# Patient Record
Sex: Female | Born: 1935 | Race: Black or African American | Hispanic: No | Marital: Single | Smoking: Never smoker
Health system: Southern US, Community
[De-identification: ages and names within clinical notes are randomized; demographics above are authoritative.]

## PROBLEM LIST (undated history)

## (undated) DIAGNOSIS — H409 Unspecified glaucoma: Secondary | ICD-10-CM

## (undated) DIAGNOSIS — D631 Anemia in chronic kidney disease: Secondary | ICD-10-CM

## (undated) DIAGNOSIS — I639 Cerebral infarction, unspecified: Secondary | ICD-10-CM

## (undated) DIAGNOSIS — M8580 Other specified disorders of bone density and structure, unspecified site: Secondary | ICD-10-CM

## (undated) DIAGNOSIS — N189 Chronic kidney disease, unspecified: Secondary | ICD-10-CM

## (undated) DIAGNOSIS — I10 Essential (primary) hypertension: Secondary | ICD-10-CM

## (undated) DIAGNOSIS — N182 Chronic kidney disease, stage 2 (mild): Secondary | ICD-10-CM

## (undated) DIAGNOSIS — E119 Type 2 diabetes mellitus without complications: Secondary | ICD-10-CM

## (undated) HISTORY — PX: ABDOMINAL HYSTERECTOMY: SHX81

---

## 2020-02-28 ENCOUNTER — Emergency Department (HOSPITAL_COMMUNITY): Payer: Medicare PPO

## 2020-02-28 ENCOUNTER — Encounter (HOSPITAL_COMMUNITY): Payer: Self-pay

## 2020-02-28 ENCOUNTER — Other Ambulatory Visit: Payer: Self-pay

## 2020-02-28 ENCOUNTER — Emergency Department (HOSPITAL_COMMUNITY)
Admission: EM | Admit: 2020-02-28 | Discharge: 2020-03-01 | Disposition: A | Discharge status: Skilled Nursing Facility | Payer: Medicare PPO | Attending: Emergency Medicine | Admitting: Emergency Medicine

## 2020-02-28 DIAGNOSIS — W010XXA Fall on same level from slipping, tripping and stumbling without subsequent striking against object, initial encounter: Secondary | ICD-10-CM | POA: Insufficient documentation

## 2020-02-28 DIAGNOSIS — I129 Hypertensive chronic kidney disease with stage 1 through stage 4 chronic kidney disease, or unspecified chronic kidney disease: Secondary | ICD-10-CM | POA: Insufficient documentation

## 2020-02-28 DIAGNOSIS — Y939 Activity, unspecified: Secondary | ICD-10-CM | POA: Insufficient documentation

## 2020-02-28 DIAGNOSIS — S4991XA Unspecified injury of right shoulder and upper arm, initial encounter: Secondary | ICD-10-CM | POA: Diagnosis present

## 2020-02-28 DIAGNOSIS — Z7984 Long term (current) use of oral hypoglycemic drugs: Secondary | ICD-10-CM | POA: Diagnosis not present

## 2020-02-28 DIAGNOSIS — S42321A Displaced transverse fracture of shaft of humerus, right arm, initial encounter for closed fracture: Secondary | ICD-10-CM | POA: Insufficient documentation

## 2020-02-28 DIAGNOSIS — Y92009 Unspecified place in unspecified non-institutional (private) residence as the place of occurrence of the external cause: Secondary | ICD-10-CM | POA: Diagnosis not present

## 2020-02-28 DIAGNOSIS — E1122 Type 2 diabetes mellitus with diabetic chronic kidney disease: Secondary | ICD-10-CM | POA: Insufficient documentation

## 2020-02-28 DIAGNOSIS — Y999 Unspecified external cause status: Secondary | ICD-10-CM | POA: Insufficient documentation

## 2020-02-28 DIAGNOSIS — N182 Chronic kidney disease, stage 2 (mild): Secondary | ICD-10-CM | POA: Diagnosis not present

## 2020-02-28 DIAGNOSIS — N189 Chronic kidney disease, unspecified: Secondary | ICD-10-CM | POA: Insufficient documentation

## 2020-02-28 DIAGNOSIS — Z79899 Other long term (current) drug therapy: Secondary | ICD-10-CM | POA: Insufficient documentation

## 2020-02-28 DIAGNOSIS — M8580 Other specified disorders of bone density and structure, unspecified site: Secondary | ICD-10-CM | POA: Insufficient documentation

## 2020-02-28 DIAGNOSIS — H409 Unspecified glaucoma: Secondary | ICD-10-CM | POA: Insufficient documentation

## 2020-02-28 DIAGNOSIS — I10 Essential (primary) hypertension: Secondary | ICD-10-CM | POA: Insufficient documentation

## 2020-02-28 HISTORY — DX: Other specified disorders of bone density and structure, unspecified site: M85.80

## 2020-02-28 HISTORY — DX: Anemia in chronic kidney disease: D63.1

## 2020-02-28 HISTORY — DX: Essential (primary) hypertension: I10

## 2020-02-28 HISTORY — DX: Cerebral infarction, unspecified: I63.9

## 2020-02-28 HISTORY — DX: Chronic kidney disease, unspecified: N18.9

## 2020-02-28 HISTORY — DX: Type 2 diabetes mellitus without complications: E11.9

## 2020-02-28 HISTORY — DX: Unspecified glaucoma: H40.9

## 2020-02-28 HISTORY — DX: Chronic kidney disease, stage 2 (mild): N18.2

## 2020-02-28 LAB — CBG MONITORING, ED: Glucose-Capillary: 170 mg/dL — ABNORMAL HIGH (ref 70–99)

## 2020-02-28 MED ORDER — OXYCODONE-ACETAMINOPHEN 5-325 MG PO TABS: 1.0000 | ORAL_TABLET | Freq: Four times a day (QID) | ORAL | 0 refills | Status: DC | PRN

## 2020-02-28 MED ORDER — LATANOPROST 0.005 % OP SOLN
1.0000 [drp] | Freq: Every day | OPHTHALMIC | Status: DC
  Administered 2020-02-28 – 2020-03-01 (×2): 1 [drp] via OPHTHALMIC
  Filled 2020-02-28 (×2): qty 2.5

## 2020-02-28 MED ORDER — TIMOLOL MALEATE 0.5 % OP SOLN
1.0000 [drp] | Freq: Every day | OPHTHALMIC | Status: DC
  Administered 2020-02-28 – 2020-03-01 (×2): 1 [drp] via OPHTHALMIC
  Filled 2020-02-28 (×2): qty 5

## 2020-02-28 MED ORDER — POLYSACCHARIDE IRON COMPLEX 150 MG PO CAPS
150.0000 mg | ORAL_CAPSULE | Freq: Every day | ORAL | Status: DC
  Administered 2020-02-28 – 2020-03-01 (×3): 150 mg via ORAL
  Filled 2020-02-28 (×3): qty 1

## 2020-02-28 MED ORDER — INSULIN GLARGINE 100 UNIT/ML ~~LOC~~ SOLN
20.0000 [IU] | Freq: Every day | SUBCUTANEOUS | Status: DC
  Administered 2020-02-28 – 2020-03-01 (×3): 20 [IU] via SUBCUTANEOUS
  Filled 2020-02-28 (×3): qty 0.2

## 2020-02-28 MED ORDER — AMLODIPINE BESYLATE 5 MG PO TABS
10.0000 mg | ORAL_TABLET | Freq: Every day | ORAL | Status: DC
  Administered 2020-02-28 – 2020-03-01 (×3): 10 mg via ORAL
  Filled 2020-02-28 (×3): qty 2

## 2020-02-28 MED ORDER — LEVOTHYROXINE SODIUM 88 MCG PO TABS
88.0000 ug | ORAL_TABLET | Freq: Every day | ORAL | Status: DC
  Administered 2020-02-28 – 2020-03-01 (×3): 88 ug via ORAL
  Filled 2020-02-28 (×3): qty 1

## 2020-02-28 MED ORDER — OXYCODONE-ACETAMINOPHEN 5-325 MG PO TABS
1.0000 | ORAL_TABLET | Freq: Once | ORAL | Status: AC
  Administered 2020-02-28: 1 via ORAL
  Filled 2020-02-28: qty 1

## 2020-02-28 MED ORDER — HYDROCHLOROTHIAZIDE 25 MG PO TABS
25.0000 mg | ORAL_TABLET | Freq: Every day | ORAL | Status: DC
  Administered 2020-02-28 – 2020-03-01 (×3): 25 mg via ORAL
  Filled 2020-02-28 (×3): qty 1

## 2020-02-28 MED ORDER — FENTANYL CITRATE (PF) 100 MCG/2ML IJ SOLN
50.0000 ug | Freq: Once | INTRAMUSCULAR | Status: AC
  Administered 2020-02-28: 50 ug via INTRAMUSCULAR
  Filled 2020-02-28: qty 2

## 2020-02-28 MED ORDER — GLIPIZIDE 5 MG PO TABS
2.5000 mg | ORAL_TABLET | Freq: Two times a day (BID) | ORAL | Status: DC
  Administered 2020-02-28 – 2020-03-01 (×3): 2.5 mg via ORAL
  Filled 2020-02-28 (×2): qty 0.5
  Filled 2020-02-28: qty 1
  Filled 2020-02-28 (×4): qty 0.5

## 2020-02-28 MED ORDER — FUROSEMIDE 20 MG PO TABS
20.0000 mg | ORAL_TABLET | Freq: Every day | ORAL | Status: DC
  Administered 2020-02-28 – 2020-03-01 (×3): 20 mg via ORAL
  Filled 2020-02-28 (×3): qty 1

## 2020-02-28 MED ORDER — ATORVASTATIN CALCIUM 40 MG PO TABS
40.0000 mg | ORAL_TABLET | Freq: Every day | ORAL | Status: DC
  Administered 2020-02-29: 40 mg via ORAL
  Filled 2020-02-28: qty 1

## 2020-02-28 NOTE — Evaluation (Signed)
Physical Therapy Evaluation Patient Details Name: Yvonne Henderson MRN: [id number] DOB: 02/28/1927 Today's Date: 02/28/2020   History of Present Illness  Patient is a 86 year old female who presented to the ED following a fall  on 6/8 /21. Patient was recently DC'd from SNF post stroke. Patient found to have displaced humeral shaft  fracture on RUE, splinted and in a sling.  Clinical Impression   The patient is requiring extensive assistance for mobility, now has R humeral fracture. Per granddaughter, patient's mental and physical status is far from her  baseline, patient was independent except driving,  prior to stroke. Pt admitted with above diagnosis.  Pt currently with functional limitations due to the deficits listed below (see PT Problem List). Pt will benefit from skilled PT to increase their independence and safety with mobility to allow discharge to the venue listed below.   If DC'd back to home, patient will  Need DME recommended and possible non emergency transport. Granddaughter is interested.    Follow Up Recommendations SNF;Supervision/Assistance - 24 hour, HHPT and OT if DC home.    Equipment Recommendations  Wheelchair (measurements PT);Wheelchair cushion (measurements PT);3in1 (PT)(tub bench)    Recommendations for Other Services       Precautions / Restrictions Precautions Required Braces or Orthoses: Sling Restrictions Weight Bearing Restrictions: Yes RUE Weight Bearing: Non weight bearing      Mobility  Bed Mobility Overal bed mobility: Needs Assistance Bed Mobility: Supine to Sit;Sit to Supine     Supine to sit: Mod assist;HOB elevated Sit to supine: Mod assist;HOB elevated   General bed mobility comments: mod assist with trunk, patient moved ;egs over bed edge, assisted with klegs and trunk back to supine.  Transfers                 General transfer comment: NT due to stetcher height and patient short in stature and not deemed safe to attempt with  concern not being able to get back onto the stretcher.  Ambulation/Gait                Stairs            Wheelchair Mobility    Modified Rankin (Stroke Patients Only)       Balance Overall balance assessment: History of Falls;Needs assistance Sitting-balance support: Feet unsupported;Single extremity supported Sitting balance-Leahy Scale: Poor Sitting balance - Comments: very drowsy Postural control: Posterior lean                                   Pertinent Vitals/Pain Pain Assessment: Faces Faces Pain Scale: Hurts even more Pain Location: RUE Pain Descriptors / Indicators: Discomfort;Guarding Pain Intervention(s): Monitored during session;Repositioned;Limited activity within patient's tolerance    Home Living Family/patient expects to be discharged to:: Private residence Living Arrangements: Other relatives Available Help at Discharge: Home health;Family;Available PRN/intermittently Type of Home: House Home Access: Stairs to enter   [employer] of Steps: 2 Home Layout: One level Home Equipment: Walker - 2 wheels Additional Comments: has moved to live with granddaughter since unable to care for self    Prior Function Level of Independence: Needs assistance   Gait / Transfers Assistance Needed: per granddaughter, primary care since Dc from SNF last week, patient has required assistance with ambulation and ADL's.  ADL's / Homemaking Assistance Needed: with help        Hand Dominance   Dominant Hand: Right  Extremity/Trunk Assessment   Upper Extremity Assessment Upper Extremity Assessment: RUE deficits/detail RUE Deficits / Details: in a slpint and sling    Lower Extremity Assessment Lower Extremity Assessment: Generalized weakness    Cervical / Trunk Assessment Cervical / Trunk Assessment: Kyphotic  Communication   Communication: HOH  Cognition Arousal/Alertness: Lethargic Behavior During Therapy: WFL for  tasks assessed/performed Overall Cognitive Status: Impaired/Different from baseline Area of Impairment: Orientation;Attention;Memory;Following commands;Safety/judgement;Awareness                 Orientation Level: Place;Time;Situation Current Attention Level: Focused Memory: Decreased recall of precautions;Decreased short-term memory Following Commands: Follows one step commands inconsistently Safety/Judgement: Decreased awareness of deficits Awareness: Intellectual   General Comments: per granddaughter, prior to Stroke/rehab, patient lived alone and was independnet, took care of all self needs, did not drive      General Comments      Exercises     Assessment/Plan    PT Assessment Patient needs continued PT services  PT Problem List Decreased strength;Decreased cognition;Decreased knowledge of precautions;Decreased range of motion;Decreased mobility;Decreased knowledge of use of DME;Decreased activity tolerance;Decreased safety awareness;Pain       PT Treatment Interventions DME instruction;Gait training;Functional mobility training;Therapeutic exercise;Therapeutic activities;Patient/family education    PT Goals (Current goals can be found in the Care Plan section)  Acute Rehab PT Goals Patient Stated Goal: per granddaughter , to be back to her baseline PT Goal Formulation: With patient/family Time For Goal Achievement: 03/13/20 Potential to Achieve Goals: Fair    Frequency Min 2X/week   Barriers to discharge Decreased caregiver support      Co-evaluation               AM-PAC PT "6 Clicks" Mobility  Outcome Measure Help needed turning from your back to your side while in a flat bed without using bedrails?: A Lot Help needed moving from lying on your back to sitting on the side of a flat bed without using bedrails?: A Lot Help needed moving to and from a bed to a chair (including a wheelchair)?: Total Help needed standing up from a chair using your arms  (e.g., wheelchair or bedside chair)?: Total Help needed to walk in hospital room?: Total Help needed climbing 3-5 steps with a railing? : Total 6 Click Score: 8    End of Session   Activity Tolerance: Patient limited by fatigue;Patient limited by pain Patient left: in bed;with call bell/phone within reach;with family/visitor present Nurse Communication: Mobility status PT Visit Diagnosis: Unsteadiness on feet (R26.81);Repeated falls (R29.6);Difficulty in walking, not elsewhere classified (R26.2);Pain Pain - Right/Left: Right Pain - part of body: Arm    Time: 9242-6834 PT Time Calculation (min) (ACUTE ONLY): 21 min   Charges:   PT Evaluation $PT Eval Moderate Complexity: [place] Pager [phone] Office [phone]  Claretha Cooper 02/28/2020, 1:09 PM

## 2020-02-28 NOTE — ED Provider Notes (Addendum)
[facility]-EMERGENCY DEPT Provider Note   CSN: [id number] Arrival date & time: 02/28/20  0442   Time seen 6:02 AM  History Chief Complaint  Patient presents with  . Fall  . Arm Pain    Yvonne Henderson is a 86 y.o. female.  HPI   Patient lives with her daughter who heard a thud and woke up and heard the patient calling for help. She states when she found her mother she was laying on her left side but she was complaining of pain in her right shoulder area. She denies hitting her head or having loss of consciousness. She denies any hip or back pain. Daughter brought her to the emergency department.  PCP Suzan Slick, MD   Past Medical History:  Diagnosis Date  . Anemia associated with chronic renal failure    Provided by family  . Benign essential HTN   . CKD (chronic kidney disease) stage 2, GFR 60-89 ml/min   . Diabetes mellitus without complication (HCC)   . Glaucoma   . Hypertension   . Osteopenia   . Stroke [facility])     Patient Active Problem List   Diagnosis Date Noted  . Anemia associated with chronic renal failure   . Glaucoma   . Osteopenia   . Benign essential HTN     Past Surgical History:  Procedure Laterality Date  . ABDOMINAL HYSTERECTOMY       OB History   No obstetric history on file.     No family history on file.  Social History   Tobacco Use  . Smoking status: Never Smoker  . Smokeless tobacco: Never Used  Substance Use Topics  . Alcohol use: Not Currently  . Drug use: Not Currently  Lives with daughter  Home Medications Prior to Admission medications   Medication Sig Start Date End Date Taking? Authorizing Provider  amLODipine (NORVASC) 10 MG tablet Take 10 mg by mouth daily. 02/22/20  Yes [provider]  atorvastatin (LIPITOR) 40 MG tablet Take 40 mg by mouth at bedtime. 02/22/20  Yes [provider]  FERREX 150 150 MG capsule Take 150 mg by mouth daily. 12/14/19  Yes [provider]  furosemide (LASIX) 20 MG tablet Take 20 mg by mouth daily. 07/19/13  Yes [provider]  glipiZIDE (GLUCOTROL) 5 MG tablet Take 2.5 mg by mouth 2 (two) times daily. 02/22/20  Yes [provider]  hydrochlorothiazide (HYDRODIURIL) 25 MG tablet Take 25 mg by mouth daily. 01/13/20  Yes [provider]  LANTUS SOLOSTAR 100 UNIT/ML Solostar Pen Inject 20 Units into the skin in the morning. 12/13/19  Yes [provider]  latanoprost (XALATAN) 0.005 % ophthalmic solution Place 1 drop into both eyes daily. 01/16/20  Yes [provider]  levothyroxine (SYNTHROID) 88 MCG tablet Take 88 mcg by mouth daily. 01/16/20  Yes [provider]  timolol (TIMOPTIC) 0.5 % ophthalmic solution Place 1 drop into both eyes daily.  06/28/13  Yes [provider]  Vitamin D, Ergocalciferol, (DRISDOL) 1.25 MG (50000 UNIT) CAPS capsule Take 50,000 Units by mouth 2 (two) times a week. 12/13/19  Yes [provider]  oxyCODONE-acetaminophen (PERCOCET/ROXICET) 5-325 MG tablet Take 1 tablet by mouth every 6 (six) hours as needed for severe pain. 02/28/20   Devoria Albe, MD    Allergies    Patient has no known allergies.  Review of Systems   Review of Systems  All other systems reviewed and are negative.  Physical Exam Updated Vital Signs BP (!) 148/79   Pulse 86   Temp 98.5 F (36.9 C) (Oral)   Resp 20   Ht 4\' 11"  (1.499 m)   Wt 59 kg   SpO2 96%   BMI 26.26 kg/m   Physical Exam Vitals and nursing note reviewed.  Constitutional:      Appearance: Normal appearance. She is obese.  HENT:     Head: Normocephalic and atraumatic.     Right Ear: External ear normal.     Left Ear: External ear normal.  Eyes:     Extraocular Movements: Extraocular movements intact.     Conjunctiva/sclera: Conjunctivae normal.     Pupils: Pupils are equal, round, and reactive to light.  Cardiovascular:     Rate and Rhythm: Normal rate.  Pulmonary:     Effort:  Pulmonary effort is normal. No respiratory distress.  Musculoskeletal:     Cervical back: Normal range of motion.     Comments: When I palpate her clavicle it is nontender until I get close to the right shoulder and then she states it is painful. There may be some swelling around the right shoulder however I did not do range of motion because she states that is where she hurts. She also seems to have some pain in her down into the midportion of her upper arm. She is not tender around her elbow or in the forearm or wrist. She has intact sensation of her fingers and can move them.  Patient has intact light touch.. Patient is right-handed.  Pulses are intact.  Skin:    General: Skin is warm and dry.  Neurological:     General: No focal deficit present.     Mental Status: She is alert and oriented to person, place, and time.     Cranial Nerves: No cranial nerve deficit.  Psychiatric:        Mood and Affect: Mood normal.        Behavior: Behavior normal.        Thought Content: Thought content normal.     ED Results / Procedures / Treatments   Labs (all labs ordered are listed, but only abnormal results are displayed) Labs Reviewed - No data to display  EKG None  Radiology DG Shoulder Right  Result Date: 02/28/2020 CLINICAL DATA:  Fall at home with arm pain EXAM: RIGHT SHOULDER - 2+ VIEW COMPARISON:  None. FINDINGS: Displaced humeral shaft fracture with adjacent soft tissue swelling. No acromioclavicular glenohumeral dislocation. IMPRESSION: Displaced humeral shaft fracture. Electronically Signed   By: Monte Fantasia M.D.   On: 02/28/2020 07:53   DG Humerus Right  Result Date: 02/28/2020 CLINICAL DATA:  Fall at home with arm pain EXAM: RIGHT HUMERUS - 2+ VIEW COMPARISON:  None. FINDINGS: Displaced humeral shaft fracture with apex ventral angulation. No evident dislocation. IMPRESSION: Displaced and angulated mid humeral shaft fracture. Electronically Signed   By: Monte Fantasia M.D.   On:  02/28/2020 07:53    Procedures Procedures (including critical care time)  Medications Ordered in ED Medications  fentaNYL (SUBLIMAZE) injection 50 mcg (50 mcg Intramuscular Given 02/28/20 3893)    ED Course  I have reviewed the triage vital signs and the nursing notes.  Pertinent labs & imaging results that were available during my care of the patient were reviewed by me and considered in my medical decision making (see chart for details).    MDM Rules/Calculators/A&P  Patient received fentanyl IM and x-rays were ordered of her right shoulder and right humerus.  7:45 AM I have looked at her xrays and she has a midshaft humeral fracture.  I have showed the picture to patient and her daughter.  When I tried to examine her nerve function she has intact dorsiflexion at the wrist and palmar flexion.  She can spread all her fingers apart.  She has difficulty testing the strength by forming a circle with her index and thumb and her little and thumb and I am not sure whether she does not understand what I am asking her to do or if it is just too painful.  We discussed putting her in a posterior splint and sling.  After her stroke patient was weak on her left side.  Daughter states she normally walks with a walker.  We discussed that now she will probably have to be in a wheelchair to get around.  We did discuss that sometimes this fracture does require surgery but most the time it does not.   Final Clinical Impression(s) / ED Diagnoses Final diagnoses:  Fall in home, initial encounter  Closed displaced transverse fracture of shaft of right humerus, initial encounter    Rx / DC Orders ED Discharge Orders         Ordered    oxyCODONE-acetaminophen (PERCOCET/ROXICET) 5-325 MG tablet  Every 6 hours PRN     02/28/20 0755    For home use only DME standard manual wheelchair with seat cushion    Comments: Patient suffers from a stroke with weakness on the left and now has a  fracture of her right upper arm which impairs their ability to perform daily activities like bathing, dressing and feeding in the home.  A walker will not resolve issue with performing activities of daily living. A wheelchair will allow patient to safely perform daily activities. Patient can safely propel the wheelchair in the home or has a caregiver who can provide assistance. Length of need 6 months . Accessories: elevating leg rests (ELRs), wheel locks, extensions and anti-tippers.   02/28/20 0759          Plan discharge  Devoria Albe, MD, Concha Pyo, MD 02/28/20 0800    Devoria Albe, MD 02/28/20 [phone]

## 2020-02-28 NOTE — ED Notes (Signed)
This RN attempted to discuss case with Dr. Lynelle Doctor to request possible Social Service involvement as Granddaughter not comfortable taking her home at this time. Pt was recently discharged from nursing facility and was staying alone at home with VNA services. Per granddaughter, VNA does not feel pt is safe to live alone. Granddaughter sts she lives over 1 hour away and has been caring for pt in her home since Saturday. Sts she works full time and is unable to provide the appropriate level of care needed at this time. Dr Lynelle Doctor stated she is being discharged to home and ordered a wheelchair for pt to use at home. No further instructions on care given. This RN discussed case with Dr. Freida Busman. Social Service consult placed for further assistance in this matter.

## 2020-02-28 NOTE — TOC Initial Note (Addendum)
Transition of Care [facility]) - Initial/Assessment Note    Patient Details  Name: Yvonne Henderson MRN: [id number] Date of Birth: 1927/08/11  [facility]) CM/SW Contact:    Yvonne Cousin, RN Phone Number: [phone] 02/28/2020, 3:17 PM  Clinical Narrative:                 TOC CM spoke to pt and grand-dtr Yvonne Henderson # [phone]. Pt was recently dc from [facility]. Yvonne Henderson is requesting pt dc to a SNF rehab. Gave permission to create FL2 and fax referral to SNF. Pt has rolling walker and cane at home. HH was set up with [facility]. CM contacted [facility] to make aware.    02/28/2020 432 pm Referral was faxed out and clinicals faxed to [facility]. Reference number [id number].      Expected Discharge Plan: Skilled Nursing Facility Barriers to Discharge: SNF Pending bed offer   Patient Goals and CMS Choice Patient states their goals for this hospitalization and ongoing recovery are:: pt believes she will go back to SNF CMS Medicare.gov Compare Post Acute Care list provided to:: Patient Choice offered to / list presented to : Patient  Expected Discharge Plan and Services Expected Discharge Plan: Skilled Nursing Facility In-house Referral: Clinical Social Work   Post Acute Care Choice: Skilled Nursing Facility Living arrangements for the past 2 months: Single Family Home                             HH Agency: [facility] ([facility]) Date HH Agency Contacted: 02/28/20 Time HH Agency Contacted: 1509 Representative spoke with at [facility], [facility], [facility] Agency: Yvonne Henderson  Prior Living Arrangements/Services Living arrangements for the past 2 months: Single Family Home Lives with:: Self   Do you feel safe going back to the place where you live?: No   lives alone, has broken arm, difficulty with seeing  Need for Family Participation in Patient Care: Yes (Comment) Care giver support system in place?: Yes (comment) Current home services:  DME(cane, rolling walker) Criminal Activity/Legal Involvement Pertinent to Current Situation/Hospitalization: No - Comment as needed  Activities of Daily Living      Permission Sought/Granted Permission sought to share information with : Case Manager Permission granted to share information with : Yes, Verbal Permission Granted  Share Information with NAME: Yvonne Henderson  Permission granted to share info w AGENCY: SNF  Permission granted to share info w Relationship: grand-daughter  Permission granted to share info w Contact Information: [phone]  Emotional Assessment Appearance:: Appears stated age Attitude/Demeanor/Rapport: Engaged Affect (typically observed): Accepting Orientation: : Oriented to Place, Oriented to  Time, Oriented to Situation, Oriented to Self   Psych Involvement: No (comment)  Admission diagnosis:  Fall Patient Active Problem List   Diagnosis Date Noted  . Anemia associated with chronic renal failure   . Glaucoma   . Osteopenia   . Benign essential HTN    PCP:  Suzan Slick, MD Pharmacy:  No Pharmacies Listed    Social Determinants of Health (SDOH) Interventions    Readmission Risk Interventions No flowsheet data found.

## 2020-02-28 NOTE — Clinical Social Work Note (Addendum)
Transition of Care [facility]) - Emergency Department Mini Assessment  Patient Details  Name: Yvonne Henderson MRN: [id number] Date of Birth: 20-Apr-1927  Transition of [facility]) CM/SW Contact:    Ewing Schlein, LCSW Phone Number: 02/28/2020, 9:58 AM  Clinical Narrative: Patient is a 86 year old female who presented to the ED following a fall at 3am while attempting to use the bathroom. TOC received consult for assistance with the patient's living situation. CSW called patient's granddaughter, Morrigan Wickens, to discuss patient's needs.  Per granddaughter, patient was discharged from [facility] last Wednesday following a CVA. Prior to the CVA, patient was safely living alone and completing her ADLs independently. Granddaughter reported that since the patient has returned home, she is no longer able to check her blood sugar or administer her insulin correctly and is "sitting in the dark at home alone." Granddaughter reported the RN with [facility] saw the patient on Saturday and told the granddaughter the company should not have been providing HH if the patient was living alone and this may warrant an APS report.  Granddaughter reported there is very limited family support near the patient, but the patient is refusing to go to a nursing facility. CSW explained that without Medicaid for LTC, patient will have to pay out of pocket for LTC or ALF as the patient owns her home and is not eligible for Medicaid. CSW discussed sitter services through [facility] to fill the gaps when family cannot provide supervision. Granddaughter acknowledged understanding of the services available to the patient at this time. CSW also provided granddaughter with the contact information [phone]) for [facility] to assist with long-term planning and navigating this process.  ED Mini Assessment: What brought you to the Emergency Department? : Fall Barriers to Discharge: Unsafe home situation Barrier interventions: Referral for  sitter services Means of departure: Car Interventions which prevented an admission or readmission: Other (must enter comment)(Referred for sitter services)  Patient Contact and Communications Key Contact 1: Tanicka Bisaillon (granddaughter) Contact Date: 02/28/20,   Contact time: 0939 Contact Phone Number: [phone] Call outcome: Patient will return home with granddaughter and follow up with sitter services Patient states their goals for this hospitalization and ongoing recovery are:: Return home  Admission diagnosis:  Fall Patient Active Problem List   Diagnosis Date Noted  . Anemia associated with chronic renal failure   . Glaucoma   . Osteopenia   . Benign essential HTN    PCP:  Suzan Slick, MD Pharmacy:  No Pharmacies Listed

## 2020-02-28 NOTE — Discharge Instructions (Signed)
Use ice packs for comfort.  Take the pain medication for severe pain.  Leave the splint in place until she is seen by the orthopedist on-call, Dr. August Saucer.  Please call his office today to get a follow-up appointment soon.  Return to the emergency department if she gets pain in her fingers, her fingers get cold or change colors.  Unfortunately she will probably not be able to use a walker until this heals, you will need to get a wheelchair for her to use.

## 2020-02-28 NOTE — ED Triage Notes (Addendum)
Pt c/o R upper arm pain and shoulder pain after a trip and fall this morning.  Pt was recently discharged from a rehab facility after a CVA.  Pain score 8/10.   Pt is currently living granddaughter after home health assessed her on Sunday and deemed her unsafe to live alone.  Was living independently prior to CVA.  Rehab did not discuss discharge planning with family per granddaughter.

## 2020-02-28 NOTE — Progress Notes (Signed)
Orthopedic Tech Progress Note Patient Details:  Yvonne Henderson 31-Aug-1927 [id number]  Ortho Devices Type of Ortho Device: Sling immobilizer, Long arm splint Ortho Device/Splint Location: Right Ortho Device/Splint Interventions: Application   Post Interventions Patient Tolerated: Well Instructions Provided: Care of device   Saul Fordyce 02/28/2020, 11:44 AM

## 2020-02-28 NOTE — ED Notes (Signed)
Enters my care. Pt resting in stretcher. Granddaughter at bedside. Unable to rate pain on 1-10 scale. Faces scale 6. Pt sts pain med given earlier was effective. Voided in bedpan. Call bell at bedside. Cont to monitor.

## 2020-02-28 NOTE — ED Notes (Signed)
Called PT office for this patient and PT therapist Clydie Braun is assigned to this patient.

## 2020-02-28 NOTE — ED Notes (Signed)
Pt to be placed in SNF. Pt, family and provider aware. PLEASE CALL GRANDDAUGHTER CHERISE Buth FOR UPDATES AT [phone]

## 2020-02-28 NOTE — ED Notes (Signed)
Ortho at bedside to apply splint. Morning meds given a/o. Pt swallowed meds one at a time and whole.

## 2020-02-28 NOTE — ED Provider Notes (Signed)
Patient had been cleared for discharge by Dr. Lynelle Doctor but after speaking with patient and family patient is unsafe to go home.  Will consult social work   Lorre Nick, MD 02/28/20 [phone]

## 2020-02-28 NOTE — NC FL2 (Signed)
[place] LEVEL OF CARE SCREENING TOOL     IDENTIFICATION  Patient Name: Yvonne Henderson Birthdate: 1926/12/02 Sex: female Admission Date (Current Location): 02/28/2020  [facility] and [place] Number:  Herbalist and Address:  [facility],  [place] [address] [place]      Provider Number: [id number]  Attending Physician Name and Address:  Default, Provider, MD  Relative Name and Phone Number:  Brailey Buescher #[phone]    Current Level of Care: SNF Recommended Level of Care: [place] Prior Approval Number:    Date Approved/Denied:   PASRR Number: [id number]  Discharge Plan: SNF    Current Diagnoses: Patient Active Problem List   Diagnosis Date Noted   Anemia associated with chronic renal failure    Glaucoma    Osteopenia    Benign essential HTN     Orientation RESPIRATION BLADDER Height & Weight     Self, Time, Situation, Place  Normal Continent Weight: 59 kg Height:  4\' 11"  (149.9 cm)  BEHAVIORAL SYMPTOMS/MOOD NEUROLOGICAL BOWEL NUTRITION STATUS      Continent Diet(carb modified)  AMBULATORY STATUS COMMUNICATION OF NEEDS Skin   Extensive Assist Verbally Normal                       Personal Care Assistance Level of Assistance  Bathing, Feeding, Dressing Bathing Assistance: Maximum assistance Feeding assistance: Maximum assistance Dressing Assistance: Maximum assistance     Functional Limitations Info  Sight, Speech, Hearing Sight Info: Impaired Hearing Info: Impaired Speech Info: Adequate    SPECIAL CARE FACTORS FREQUENCY  PT (By licensed PT), OT (By licensed OT)(glucometer checks per discharge summary)     PT Frequency: 5x per week OT Frequency: 5x per week            Contractures Contractures Info: Not present    Additional Factors Info  Code Status, Allergies Code Status Info: Full Code Allergies Info: No Known Allergies           Current Medications  (02/28/2020):  This is the current hospital active medication list Current Facility-Administered Medications  Medication Dose Route Frequency Provider Last Rate Last Admin   amLODipine (NORVASC) tablet 10 mg  10 mg Oral Daily Lacretia Leigh, MD   10 mg at 02/28/20 1125   atorvastatin (LIPITOR) tablet 40 mg  40 mg Oral QHS Lacretia Leigh, MD       furosemide (LASIX) tablet 20 mg  20 mg Oral Daily Lacretia Leigh, MD   20 mg at 02/28/20 1124   glipiZIDE (GLUCOTROL) tablet 2.5 mg  2.5 mg Oral BID Dorena Cookey, MD   2.5 mg at 02/28/20 1126   hydrochlorothiazide (HYDRODIURIL) tablet 25 mg  25 mg Oral Daily Lacretia Leigh, MD   25 mg at 02/28/20 1126   insulin glargine (LANTUS) injection 20 Units  20 Units Subcutaneous Daily Lacretia Leigh, MD   20 Units at 02/28/20 1128   iron polysaccharides (NIFEREX) capsule 150 mg  150 mg Oral Daily Lacretia Leigh, MD   150 mg at 02/28/20 1126   latanoprost (XALATAN) 0.005 % ophthalmic solution 1 drop  1 drop Both Eyes Daily Lacretia Leigh, MD   1 drop at 02/28/20 1127   levothyroxine (SYNTHROID) tablet 88 mcg  88 mcg Oral Daily Lacretia Leigh, MD   88 mcg at 02/28/20 1125   timolol (TIMOPTIC) 0.5 % ophthalmic solution 1 drop  1 drop Both Eyes Daily Lacretia Leigh, MD  1 drop at 02/28/20 1128   Current Outpatient Medications  Medication Sig Dispense Refill   amLODipine (NORVASC) 10 MG tablet Take 10 mg by mouth daily.     atorvastatin (LIPITOR) 40 MG tablet Take 40 mg by mouth at bedtime.     FERREX 150 150 MG capsule Take 150 mg by mouth daily.     furosemide (LASIX) 20 MG tablet Take 20 mg by mouth daily.     glipiZIDE (GLUCOTROL) 5 MG tablet Take 2.5 mg by mouth 2 (two) times daily.     hydrochlorothiazide (HYDRODIURIL) 25 MG tablet Take 25 mg by mouth daily.     LANTUS SOLOSTAR 100 UNIT/ML Solostar Pen Inject 20 Units into the skin in the morning.     latanoprost (XALATAN) 0.005 % ophthalmic solution Place 1 drop into both eyes daily.      levothyroxine (SYNTHROID) 88 MCG tablet Take 88 mcg by mouth daily.     timolol (TIMOPTIC) 0.5 % ophthalmic solution Place 1 drop into both eyes daily.      Vitamin D, Ergocalciferol, (DRISDOL) 1.25 MG (50000 UNIT) CAPS capsule Take 50,000 Units by mouth 2 (two) times a week.     oxyCODONE-acetaminophen (PERCOCET/ROXICET) 5-325 MG tablet Take 1 tablet by mouth every 6 (six) hours as needed for severe pain. 16 tablet 0     Discharge Medications: Please see discharge summary for a list of discharge medications.  Relevant Imaging Results:  Relevant Lab Results:   Additional Information SS # [id number]  Elliot Cousin, RN

## 2020-02-29 LAB — CBG MONITORING, ED
Glucose-Capillary: 129 mg/dL — ABNORMAL HIGH (ref 70–99)
Glucose-Capillary: 65 mg/dL — ABNORMAL LOW (ref 70–99)
Glucose-Capillary: 94 mg/dL (ref 70–99)
Glucose-Capillary: 121 mg/dL — ABNORMAL HIGH (ref 70–99)

## 2020-02-29 MED ORDER — ACETAMINOPHEN 500 MG PO TABS
1000.0000 mg | ORAL_TABLET | Freq: Once | ORAL | Status: AC
  Administered 2020-02-29: 1000 mg via ORAL
  Filled 2020-02-29: qty 2

## 2020-02-29 NOTE — NC FL2 (Signed)
[place] MEDICAID FL2 LEVEL OF CARE SCREENING TOOL     IDENTIFICATION  Patient Name: Yvonne Henderson Birthdate: 10/05/1926 Sex: female Admission Date (Current Location): 02/28/2020  [facility] and [place] Number:  Producer, television/film/video and Address:  [facility],  [address] [place] [address]      Provider Number: [id number]  Attending Physician Name and Address:  Default, Provider, MD  Relative Name and Phone Number:  Ammy Lienhard #[phone]    Current Level of Care: Hospital Recommended Level of Care: Skilled Nursing Facility Prior Approval Number:    Date Approved/Denied:   PASRR Number: [id number]  Discharge Plan: SNF    Current Diagnoses: Patient Active Problem List   Diagnosis Date Noted  . Anemia associated with chronic renal failure   . Glaucoma   . Osteopenia   . Benign essential HTN     Orientation RESPIRATION BLADDER Height & Weight     Self, Time, Situation, Place  Normal Continent Weight: 59 kg Height:  4\' 11"  (149.9 cm)  BEHAVIORAL SYMPTOMS/MOOD NEUROLOGICAL BOWEL NUTRITION STATUS      Continent Diet(carb modified)  AMBULATORY STATUS COMMUNICATION OF NEEDS Skin   Extensive Assist Verbally Normal                       Personal Care Assistance Level of Assistance  Bathing, Feeding, Dressing Bathing Assistance: Maximum assistance Feeding assistance: Maximum assistance Dressing Assistance: Maximum assistance     Functional Limitations Info  Sight, Speech, Hearing Sight Info: Impaired Hearing Info: Impaired Speech Info: Adequate    SPECIAL CARE FACTORS FREQUENCY  PT (By licensed PT), OT (By licensed OT)(glucometer checks per discharge summary)     PT Frequency: 5x per week OT Frequency: 5x per week            Contractures Contractures Info: Not present    Additional Factors Info  Code Status, Allergies Code Status Info: Full Code Allergies Info: No Known Allergies           Current Medications  (02/29/2020):  This is the current hospital active medication list Current Facility-Administered Medications  Medication Dose Route Frequency Provider Last Rate Last Admin  . amLODipine (NORVASC) tablet 10 mg  10 mg Oral Daily 04/30/2020, MD   10 mg at 02/28/20 1125  . atorvastatin (LIPITOR) tablet 40 mg  40 mg Oral QHS 04/29/20, MD   Stopped at 02/29/20 0631  . furosemide (LASIX) tablet 20 mg  20 mg Oral Daily 04/30/20, MD   20 mg at 02/28/20 1124  . glipiZIDE (GLUCOTROL) tablet 2.5 mg  2.5 mg Oral BID AC 04/29/20, MD   2.5 mg at 02/28/20 1800  . hydrochlorothiazide (HYDRODIURIL) tablet 25 mg  25 mg Oral Daily 04/29/20, MD   25 mg at 02/28/20 1126  . insulin glargine (LANTUS) injection 20 Units  20 Units Subcutaneous Daily 04/29/20, MD   20 Units at 02/28/20 1128  . iron polysaccharides (NIFEREX) capsule 150 mg  150 mg Oral Daily 04/29/20, MD   150 mg at 02/28/20 1126  . latanoprost (XALATAN) 0.005 % ophthalmic solution 1 drop  1 drop Both Eyes Daily 04/29/20, MD   1 drop at 02/28/20 1127  . levothyroxine (SYNTHROID) tablet 88 mcg  88 mcg Oral Daily 04/29/20, MD   88 mcg at 02/28/20 1125  . timolol (TIMOPTIC) 0.5 % ophthalmic solution 1 drop  1 drop Both Eyes Daily 04/29/20, MD  1 drop at 02/28/20 1128   Current Outpatient Medications  Medication Sig Dispense Refill  . amLODipine (NORVASC) 10 MG tablet Take 10 mg by mouth daily.    Marland Kitchen atorvastatin (LIPITOR) 40 MG tablet Take 40 mg by mouth at bedtime.    Marland Kitchen FERREX 150 150 MG capsule Take 150 mg by mouth daily.    . furosemide (LASIX) 20 MG tablet Take 20 mg by mouth daily.    Marland Kitchen glipiZIDE (GLUCOTROL) 5 MG tablet Take 2.5 mg by mouth 2 (two) times daily.    . hydrochlorothiazide (HYDRODIURIL) 25 MG tablet Take 25 mg by mouth daily.    Marland Kitchen LANTUS SOLOSTAR 100 UNIT/ML Solostar Pen Inject 20 Units into the skin in the morning.    . latanoprost (XALATAN) 0.005 % ophthalmic solution Place 1 drop  into both eyes daily.    Marland Kitchen levothyroxine (SYNTHROID) 88 MCG tablet Take 88 mcg by mouth daily.    . timolol (TIMOPTIC) 0.5 % ophthalmic solution Place 1 drop into both eyes daily.     . Vitamin D, Ergocalciferol, (DRISDOL) 1.25 MG (50000 UNIT) CAPS capsule Take 50,000 Units by mouth 2 (two) times a week.    Marland Kitchen oxyCODONE-acetaminophen (PERCOCET/ROXICET) 5-325 MG tablet Take 1 tablet by mouth every 6 (six) hours as needed for severe pain. 16 tablet 0     Discharge Medications: Please see discharge summary for a list of discharge medications.  Relevant Imaging Results:  Relevant Lab Results:   Additional Information SS # [id number]  Erenest Rasher, RN

## 2020-02-29 NOTE — ED Notes (Signed)
Called pharmacy to tube up medications.

## 2020-02-29 NOTE — Progress Notes (Signed)
CSW called [employer] line and was told that a decision on pt's auth has not yet been received.  TOC CSW/RN CM awaiting return call from [facility] with a decision.  RN CM updated.  CSW will continue to follow for D/C needs.  Yvonne Henderson. Josetta Wigal  MSW, LCSW, LCAS, CCS Transitions of Care Clinical Social Worker Care Coordination Department Ph: [phone]

## 2020-02-29 NOTE — ED Notes (Signed)
Blood glucose 65. RN Notified

## 2020-02-29 NOTE — ED Notes (Signed)
Blood Glucose 94

## 2020-02-29 NOTE — ED Notes (Signed)
PT placed on bedpan

## 2020-02-29 NOTE — ED Notes (Signed)
Pt has been sleeping and alert. Pt calm and cooperative, no s/s of distress.  Sitter at bedside

## 2020-02-29 NOTE — ED Notes (Signed)
Family at bedside. 

## 2020-02-29 NOTE — ED Notes (Signed)
Pt to room 28. Pt alert, calm, cooperative, no s/s of distress. Pt oriented to unit. Pt made comfortable. Sitter at bedside.

## 2020-03-01 LAB — CBG MONITORING, ED: Glucose-Capillary: 120 mg/dL — ABNORMAL HIGH (ref 70–99)

## 2020-03-01 MED ORDER — ACETAMINOPHEN 500 MG PO TABS
1000.0000 mg | ORAL_TABLET | Freq: Once | ORAL | Status: AC
  Administered 2020-03-01: 1000 mg via ORAL
  Filled 2020-03-01: qty 2

## 2020-03-01 NOTE — Progress Notes (Addendum)
TOC CM spoke to granddtr and they wanted [facility] [place] or [facility]. Contacted [facility] and they do not have beds available. [facility] and they do have available bed. Contacted [employer] with update and to follow up on prior auth for SNF. Isidoro Donning RN CCM, [facility] ED TOC CM [phone]  Spoke to [facility] and pt was accepted, reference # [id number], Idelia Salm Case worker. [facility] did not have bed. Dtr, Cherise made aware and she is agreeable to [facility]. Spoke to [facility], IT trainer. They do have bed. Will need to pay $500 for copay for 10 days. She has 5 days left from previous last SNF stay last month. Call report to [facility]P, # [phone]. Isidoro Donning RN CCM, [facility] ED TOC CM [phone]

## 2020-03-01 NOTE — ED Notes (Signed)
Pt has been alert and sleeping this shift. Pt calm, cooperative, no s/s. Pt is able to eat  And medication compliant.

## 2020-03-01 NOTE — ED Provider Notes (Signed)
Patient discharged and will go to SNF.  No complaints at my care.   Virgina Norfolk, DO 03/01/20 1041

## 2020-03-01 NOTE — ED Notes (Signed)
Pt DC off unit to facility per provider. Pt calm, cooperative, no s/s of distress. DC information given to [employer] staff for facility. Pt off unit on stretcher. Pt transported by [facility].

## 2020-03-01 NOTE — Progress Notes (Signed)
[facility] arranged for pick up at 230 pm to be at facility at 330 pm. Isidoro Donning RN CCM, [facility] ED TOC CM [phone]

## 2020-03-01 NOTE — Progress Notes (Signed)
After Visit Summary Yvonne Henderson MRN: [id number] Icon Date  02/28/2020 Icon Location  [facility]EMERGENCY [id number] After Visit Summary Instructions    Instructions  Use ice packs for comfort.  Take the pain medication for severe pain.  Leave the splint in place until she is seen by the orthopedist on-call, Dr. August Saucer.  Please call his office today to get a follow-up appointment soon.  Return to the emergency department if she gets pain in her fingers, her fingers get cold or change colors.  Unfortunately she will probably not be able to use a walker until this heals, you will need to get a wheelchair for her to use. Your personalized instructions can be found at the end of this document.  Icon medication changes this visit        Your medications have changed  Icon medications to start taking   START taking:  oxyCODONE-acetaminophen(PERCOCET/ROXICET) Review your updated medication list below.  Icon List of Attachments     Read the attached information   Humerus Fracture Treated With Immobilization (English)  Icon medication pick up information              Pick up these medications from any pharmacy with your printed prescription   oxyCODONE-acetaminophen  Icon To Do Follow Up Information              Follow up with Renetta Chalk, MD  Specialty: [facility] Medicine Contact: [address]  [place]  [place] [address]  [phone]    Icon To Do Follow Up Information              Follow up with Burnard Bunting, MD  Specialty: Orthopedic Surgery Contact: [address]  [place] [address]  [phone]   Today's Visit  You were seen by Provider Default, MD Reason for Visit  0 Fall 0 Arm Pain Diagnoses  0 Fall in home, initial encounter 0 Closed displaced transverse fracture of shaft of right humerus, initial encounter Icon Lab Tests Completed    Lab Tests Completed   CBG monitoring, ED performed 4 times  POC CBG, ED performed 2  times Icon Imaging Orders Placed Today           Imaging Tests   DG Humerus Right  DG Shoulder Right Icon Procedures        Done Today   Apply sling arm foam  Apply splint long arm  Consult to Transition of Care Team (SW and CM) Icon medication changes this visit        Medications Given   acetaminophen (TYLENOL) Last given 02/29/2020 8:44 PM   amLODipine (NORVASC) Last given 03/01/2020 9:16 AM   atorvastatin (LIPITOR) Last given 02/29/2020 9:20 PM   fentaNYL (SUBLIMAZE) Last given 02/28/2020 6:33 AM   furosemide (Lasix) Last given 03/01/2020 9:17 AM   glipiZIDE (GLUCOTROL) Last given 03/01/2020 8:08 AM   hydrochlorothiazide (HYDRODIURIL) Last given 03/01/2020 9:16 AM   insulin glargine (LANTUS) Last given 03/01/2020 9:17 AM   iron polysaccharides (Ferrex 150) Last given 03/01/2020 9:17 AM   latanoprost (XALATAN) Last given 02/28/2020 11:27 AM   levothyroxine (SYNTHROID) Last given 03/01/2020 9:16 AM   oxyCODONE-acetaminophen (PERCOCET/ROXICET) Last given 02/28/2020 11:25 AM   timolol (TIMOPTIC) Last given 02/28/2020 11:28 AM   Icon Blood Pressure               Blood Pressure 146/60  Icon Temperature           Temperature (Oral) 98.9  F  Icon Pulse         Pulse 79  Icon respiration         Respiration 15  Icon oxygen saturation      Oxygen Saturation 94% What's Next  You currently have no upcoming appointments scheduled. Your Medication List  TAKE these medications  TAKE these medications  Icon medications to start taking   oxyCODONE-acetaminophen 5-325 MG tablet Commonly known as: PERCOCET/ROXICET Take 1 tablet by mouth every 6 (six) hours as needed for severe pain.  ASK your doctor about these medications  ASK your doctor about these medications  Icon medications to ask your healthcare provider about   amLODipine 10 MG tablet Commonly known as: NORVASC   Icon medications to ask your healthcare provider about   atorvastatin 40 MG tablet Commonly known as: LIPITOR   Icon  medications to ask your healthcare provider about   Ferrex 150 150 MG capsule Generic drug: iron polysaccharides   Icon medications to ask your healthcare provider about   glipiZIDE 5 MG tablet Commonly known as: GLUCOTROL   Icon medications to ask your healthcare provider about   hydrochlorothiazide 25 MG tablet Commonly known as: HYDRODIURIL   Icon medications to ask your healthcare provider about   Lantus SoloStar 100 UNIT/ML Solostar Pen Generic drug: insulin glargine   Icon medications to ask your healthcare provider about   Lasix 20 MG tablet Generic drug: furosemide   Icon medications to ask your healthcare provider about   latanoprost 0.005 % ophthalmic solution Commonly known as: XALATAN   Icon medications to ask your healthcare provider about   levothyroxine 88 MCG tablet Commonly known as: SYNTHROID   Icon medications to ask your healthcare provider about   timolol 0.5 % ophthalmic solution Commonly known as: TIMOPTIC   Icon medications to ask your healthcare provider about   Vitamin D (Ergocalciferol) 1.25 MG (50000 UNIT) Caps capsule Commonly known as: DRISDOL       MyChart Sign-Up  Send messages to your doctor, view your test results, renew your prescriptions, schedule appointments, and more.  Go to [email or web address], click "Sign Up Now", and enter your personal activation code: [email or web address]. Activation code expires 04/13/2020. Icon List of Attachments     Attached Information Humerus Fracture Treated With Immobilization (English) Humerus Fracture Treated With Immobilization ?  A humerus fracture is a break in the large bone in the upper arm (humerus). If the joint is stable and the bones are still in their normal position (nondisplaced), the injury may be treated with immobilization. This involves the use of a cast, splint, or sling to hold your arm in place. Immobilization ensures that your bones continue to stay in the correct position  while your arm is healing. What are the causes? This condition may be caused by:  A fall.  A hard, direct hit to the arm.  A motor vehicle accident. What increases the risk? The following factors may make you more likely to develop this condition:  Being elderly.  Having a disease that makes the bones thin and weak. What are the signs or symptoms? Symptoms of this condition include:  Pain.  Swelling.  Bruising.  Not being able to move your arm normally. How is this diagnosed? This condition may be diagnosed based on:  A physical exam.  X-rays of your upper arm, elbow, and shoulder.  CT scan. How is this treated? Treatment for this condition involves wearing a cast, splint, or sling until the injured area is  stable enough for you to begin range-of-motion exercises. You may also be prescribed pain medicine. Follow these instructions at home: If you have a cast:  Do not stick anything inside the cast to scratch your skin. Doing that increases your risk of infection.  Check the skin around the cast every day. Tell your health care provider about any concerns.  You may put lotion on dry skin around the edges of the cast. Do not put lotion on the skin underneath the cast.  Keep the cast clean and dry. If you have a splint or sling:  Wear the splint or sling as told by your health care provider. Remove it only as told by your health care provider.  Loosen the splint or sling if your fingers tingle, become numb, or turn cold and blue.  Keep the splint or sling clean and dry. Bathing  Do not take baths, swim, or use a hot tub until your health care provider approves. Ask your health care provider if you may take showers. You may only be allowed to take sponge baths.  If the cast, splint, or sling is not waterproof: ? Do not let it get wet. ? Cover it with a watertight covering when you take a bath or shower.  If you have a sling, remove it for bathing only if your  health care provider tells you that it is safe to do that. Managing pain, stiffness, and swelling ?   If directed, put ice on the injured area. ? If you have a removable splint or sling, remove it as told by your health care provider. ? Put ice in a plastic bag. ? Place a towel between your skin and the bag or between your cast and the bag. ? Leave the ice on for 20 minutes, 2-3 times a day.  Move your fingers often to reduce stiffness and swelling.  Raise (elevate) the injured area above the level of your heart while you are sitting or lying down. Driving  Do not drive or use heavy machinery while taking prescription pain medicine.  Do not drive while wearing a cast, splint, or sling on an arm that you use for driving. Ask your health care provider when it is safe to drive. Activity  Return to your normal activities as told by your health care provider. Ask your health care provider what activities are safe for you.  Do not lift anything until your health care provider says that it is safe.  Do range-of-motion exercises only as told by your health care provider or physical therapist. General instructions  Do not put pressure on any part of the cast or splint until it is fully hardened. This may take several hours.  Do not use any products that contain nicotine or tobacco, such as cigarettes, e-cigarettes, and chewing tobacco. These can delay bone healing. If you need help quitting, ask your health care provider.  Take over-the-counter and prescription medicines only as told by your health care provider.  Ask your health care provider if the medicine prescribed to you can cause constipation. You may need to take steps to prevent or treat constipation, such as: ? Drink enough fluid to keep your urine pale yellow. ? Take over-the-counter or prescription medicines. ? Eat foods that are high in fiber, such as beans, whole grains, and fresh fruits and vegetables. ? Limit foods that  are high in fat and processed sugars, such as fried or sweet foods.  Keep all follow-up visits as told by  your health care provider. This is important. Contact a health care provider if:  You have any new pain, swelling, or bruising.  Your pain, swelling, and bruising do not improve.  Your cast, splint, or sling becomes loose or damaged. Get help right away if:  Your skin or fingers on your injured arm turn blue or gray.  Your arm feels cold or numb.  You have severe pain in your injured arm. Summary  A humerus fracture is a break in the large bone in the upper arm.  Immobilization involves the use of a cast, splint, or sling to hold your arm in place while the injury heals.  Wear a splint or sling as told by your health care provider. Remove it only as told by your health care provider.  Move your fingers often to reduce stiffness and swelling. This information is not intended to replace advice given to you by your health care provider. Make sure you discuss any questions you have with your health care provider. Document Revised: 05/10/2018 Document Reviewed: 05/10/2018 Elsevier Patient Education  [place].

## 2020-03-01 NOTE — ED Notes (Signed)
Pt alert at times. Pt calm, cooperative, quiet voice. Pt ate breakfast. Pt resting comfortably . Sitter at bedside.

## 2020-03-06 ENCOUNTER — Inpatient Hospital Stay (HOSPITAL_COMMUNITY)
Admission: AD | Admit: 2020-03-06 | Discharge: 2020-03-09 | DRG: 065 | Disposition: A | Discharge status: Skilled Nursing Facility | Payer: Medicare PPO | Source: Skilled Nursing Facility | Attending: Internal Medicine | Admitting: Internal Medicine

## 2020-03-06 ENCOUNTER — Emergency Department (HOSPITAL_COMMUNITY): Payer: Medicare PPO

## 2020-03-06 ENCOUNTER — Other Ambulatory Visit: Payer: Self-pay

## 2020-03-06 ENCOUNTER — Encounter (HOSPITAL_COMMUNITY): Payer: Self-pay

## 2020-03-06 DIAGNOSIS — Z794 Long term (current) use of insulin: Secondary | ICD-10-CM

## 2020-03-06 DIAGNOSIS — S42301D Unspecified fracture of shaft of humerus, right arm, subsequent encounter for fracture with routine healing: Secondary | ICD-10-CM | POA: Diagnosis not present

## 2020-03-06 DIAGNOSIS — E1122 Type 2 diabetes mellitus with diabetic chronic kidney disease: Secondary | ICD-10-CM | POA: Diagnosis present

## 2020-03-06 DIAGNOSIS — N182 Chronic kidney disease, stage 2 (mild): Secondary | ICD-10-CM | POA: Diagnosis present

## 2020-03-06 DIAGNOSIS — Z7982 Long term (current) use of aspirin: Secondary | ICD-10-CM

## 2020-03-06 DIAGNOSIS — M8580 Other specified disorders of bone density and structure, unspecified site: Secondary | ICD-10-CM | POA: Diagnosis present

## 2020-03-06 DIAGNOSIS — G8191 Hemiplegia, unspecified affecting right dominant side: Secondary | ICD-10-CM | POA: Diagnosis present

## 2020-03-06 DIAGNOSIS — H409 Unspecified glaucoma: Secondary | ICD-10-CM | POA: Diagnosis present

## 2020-03-06 DIAGNOSIS — I63532 Cerebral infarction due to unspecified occlusion or stenosis of left posterior cerebral artery: Secondary | ICD-10-CM

## 2020-03-06 DIAGNOSIS — I63432 Cerebral infarction due to embolism of left posterior cerebral artery: Secondary | ICD-10-CM | POA: Diagnosis present

## 2020-03-06 DIAGNOSIS — I639 Cerebral infarction, unspecified: Secondary | ICD-10-CM | POA: Diagnosis not present

## 2020-03-06 DIAGNOSIS — R29810 Facial weakness: Secondary | ICD-10-CM | POA: Diagnosis present

## 2020-03-06 DIAGNOSIS — R29718 NIHSS score 18: Secondary | ICD-10-CM | POA: Diagnosis present

## 2020-03-06 DIAGNOSIS — I69811 Memory deficit following other cerebrovascular disease: Secondary | ICD-10-CM | POA: Diagnosis not present

## 2020-03-06 DIAGNOSIS — R471 Dysarthria and anarthria: Secondary | ICD-10-CM | POA: Diagnosis present

## 2020-03-06 DIAGNOSIS — I129 Hypertensive chronic kidney disease with stage 1 through stage 4 chronic kidney disease, or unspecified chronic kidney disease: Secondary | ICD-10-CM | POA: Diagnosis present

## 2020-03-06 DIAGNOSIS — E039 Hypothyroidism, unspecified: Secondary | ICD-10-CM | POA: Diagnosis present

## 2020-03-06 DIAGNOSIS — E876 Hypokalemia: Secondary | ICD-10-CM | POA: Diagnosis present

## 2020-03-06 DIAGNOSIS — Z8249 Family history of ischemic heart disease and other diseases of the circulatory system: Secondary | ICD-10-CM | POA: Diagnosis not present

## 2020-03-06 DIAGNOSIS — Z20822 Contact with and (suspected) exposure to covid-19: Secondary | ICD-10-CM | POA: Diagnosis present

## 2020-03-06 DIAGNOSIS — E785 Hyperlipidemia, unspecified: Secondary | ICD-10-CM | POA: Diagnosis present

## 2020-03-06 DIAGNOSIS — Z79890 Hormone replacement therapy (postmenopausal): Secondary | ICD-10-CM

## 2020-03-06 DIAGNOSIS — Z79899 Other long term (current) drug therapy: Secondary | ICD-10-CM

## 2020-03-06 DIAGNOSIS — E1159 Type 2 diabetes mellitus with other circulatory complications: Secondary | ICD-10-CM | POA: Diagnosis not present

## 2020-03-06 DIAGNOSIS — W19XXXD Unspecified fall, subsequent encounter: Secondary | ICD-10-CM | POA: Diagnosis present

## 2020-03-06 DIAGNOSIS — D631 Anemia in chronic kidney disease: Secondary | ICD-10-CM | POA: Diagnosis present

## 2020-03-06 DIAGNOSIS — E7800 Pure hypercholesterolemia, unspecified: Secondary | ICD-10-CM | POA: Diagnosis not present

## 2020-03-06 DIAGNOSIS — Z90710 Acquired absence of both cervix and uterus: Secondary | ICD-10-CM | POA: Diagnosis not present

## 2020-03-06 DIAGNOSIS — I10 Essential (primary) hypertension: Secondary | ICD-10-CM | POA: Diagnosis not present

## 2020-03-06 LAB — CBC WITH DIFFERENTIAL/PLATELET
Abs Immature Granulocytes: 0.04 10*3/uL (ref 0.00–0.07)
Basophils Absolute: 0.1 10*3/uL (ref 0.0–0.1)
Basophils Relative: 1 %
Eosinophils Absolute: 0.1 10*3/uL (ref 0.0–0.5)
Eosinophils Relative: 1 %
HCT: 35.2 % — ABNORMAL LOW (ref 36.0–46.0)
Hemoglobin: 11.5 g/dL — ABNORMAL LOW (ref 12.0–15.0)
Immature Granulocytes: 0 %
Lymphocytes Relative: 22 %
Lymphs Abs: 2.1 10*3/uL (ref 0.7–4.0)
MCH: 28.3 pg (ref 26.0–34.0)
MCHC: 32.7 g/dL (ref 30.0–36.0)
MCV: 86.7 fL (ref 80.0–100.0)
Monocytes Absolute: 0.8 10*3/uL (ref 0.1–1.0)
Monocytes Relative: 9 %
Neutro Abs: 6.4 10*3/uL (ref 1.7–7.7)
Neutrophils Relative %: 67 %
Platelets: 306 10*3/uL (ref 150–400)
RBC: 4.06 MIL/uL (ref 3.87–5.11)
RDW: 13.5 % (ref 11.5–15.5)
WBC: 9.4 10*3/uL (ref 4.0–10.5)
nRBC: 0 % (ref 0.0–0.2)

## 2020-03-06 LAB — GLUCOSE, CAPILLARY
Glucose-Capillary: 60 mg/dL — ABNORMAL LOW (ref 70–99)
Glucose-Capillary: 223 mg/dL — ABNORMAL HIGH (ref 70–99)

## 2020-03-06 LAB — SARS CORONAVIRUS 2 BY RT PCR (HOSPITAL ORDER, PERFORMED IN ~~LOC~~ HOSPITAL LAB): SARS Coronavirus 2: NEGATIVE

## 2020-03-06 LAB — CBG MONITORING, ED: Glucose-Capillary: 101 mg/dL — ABNORMAL HIGH (ref 70–99)

## 2020-03-06 LAB — BASIC METABOLIC PANEL
Anion gap: 10 (ref 5–15)
BUN: 18 mg/dL (ref 8–23)
CO2: 30 mmol/L (ref 22–32)
Calcium: 8.7 mg/dL — ABNORMAL LOW (ref 8.9–10.3)
Chloride: 101 mmol/L (ref 98–111)
Creatinine, Ser: 0.96 mg/dL (ref 0.44–1.00)
GFR calc Af Amer: >60 mL/min (ref >60)
GFR calc non Af Amer: 52 mL/min — ABNORMAL LOW (ref >60)
Glucose, Bld: 105 mg/dL — ABNORMAL HIGH (ref 70–99)
Potassium: 3.2 mmol/L — ABNORMAL LOW (ref 3.5–5.1)
Sodium: 141 mmol/L (ref 135–145)

## 2020-03-06 MED ORDER — SODIUM CHLORIDE 0.9 % IV SOLN: INTRAVENOUS | Status: AC

## 2020-03-06 MED ORDER — DEXTROSE 50 % IV SOLN: 25.0000 mL | Freq: Once | INTRAVENOUS | Status: DC

## 2020-03-06 MED ORDER — ASPIRIN EC 325 MG PO TBEC
325.0000 mg | DELAYED_RELEASE_TABLET | Freq: Every day | ORAL | Status: DC
  Administered 2020-03-06 – 2020-03-09 (×4): 325 mg via ORAL
  Filled 2020-03-06 (×4): qty 1

## 2020-03-06 MED ORDER — ACETAMINOPHEN 325 MG PO TABS: 650.0000 mg | ORAL_TABLET | Freq: Four times a day (QID) | ORAL | Status: DC | PRN

## 2020-03-06 MED ORDER — INSULIN ASPART 100 UNIT/ML ~~LOC~~ SOLN
0.0000 [IU] | Freq: Three times a day (TID) | SUBCUTANEOUS | Status: DC
  Administered 2020-03-07: 1 [IU] via SUBCUTANEOUS

## 2020-03-06 MED ORDER — CLOPIDOGREL BISULFATE 75 MG PO TABS
75.0000 mg | ORAL_TABLET | Freq: Every day | ORAL | Status: DC
  Administered 2020-03-06 – 2020-03-09 (×4): 75 mg via ORAL
  Filled 2020-03-06 (×4): qty 1

## 2020-03-06 MED ORDER — SENNOSIDES-DOCUSATE SODIUM 8.6-50 MG PO TABS: 1.0000 | ORAL_TABLET | Freq: Every evening | ORAL | Status: DC | PRN

## 2020-03-06 MED ORDER — LATANOPROST 0.005 % OP SOLN
1.0000 [drp] | Freq: Every day | OPHTHALMIC | Status: DC
  Administered 2020-03-07 – 2020-03-09 (×3): 1 [drp] via OPHTHALMIC
  Filled 2020-03-06: qty 2.5

## 2020-03-06 MED ORDER — LEVOTHYROXINE SODIUM 88 MCG PO TABS
88.0000 ug | ORAL_TABLET | Freq: Every day | ORAL | Status: DC
  Administered 2020-03-07 – 2020-03-09 (×3): 88 ug via ORAL
  Filled 2020-03-06 (×3): qty 1

## 2020-03-06 MED ORDER — ATORVASTATIN CALCIUM 80 MG PO TABS
80.0000 mg | ORAL_TABLET | Freq: Every day | ORAL | Status: DC
  Administered 2020-03-07 – 2020-03-09 (×3): 80 mg via ORAL
  Filled 2020-03-06 (×3): qty 1

## 2020-03-06 MED ORDER — POTASSIUM CHLORIDE 10 MEQ/100ML IV SOLN
10.0000 meq | INTRAVENOUS | Status: AC
  Administered 2020-03-06 – 2020-03-07 (×4): 10 meq via INTRAVENOUS
  Filled 2020-03-06 (×4): qty 100

## 2020-03-06 MED ORDER — TIMOLOL MALEATE 0.5 % OP SOLN
1.0000 [drp] | Freq: Every day | OPHTHALMIC | Status: DC
  Administered 2020-03-07 – 2020-03-09 (×3): 1 [drp] via OPHTHALMIC
  Filled 2020-03-06: qty 5

## 2020-03-06 MED ORDER — DEXTROSE 50 % IV SOLN
1.0000 | Freq: Once | INTRAVENOUS | Status: AC
  Administered 2020-03-06: 50 mL via INTRAVENOUS
  Filled 2020-03-06: qty 50

## 2020-03-06 MED ORDER — ENOXAPARIN SODIUM 30 MG/0.3ML ~~LOC~~ SOLN
30.0000 mg | SUBCUTANEOUS | Status: DC
  Administered 2020-03-06: 30 mg via SUBCUTANEOUS
  Filled 2020-03-06: qty 0.3

## 2020-03-06 NOTE — ED Triage Notes (Addendum)
Pt arrived via [facility] from [facility] d/t slurred speech that started "last Thursday" (per pt's grand daughter who is at bedside). Family reports that her speech is also "slower" as well, & her cognition has changed as well. Upon arrival to ED pt is A&Ox3- disoriented to time. Verbal-able to make needs known, speech is slow, VSS, Rt visual field area lacks peripheral attention.

## 2020-03-06 NOTE — Consult Note (Addendum)
Neurology Consultation  Reason for Consult: Stroke Referring Physician: Tegeler  CC: Right-sided weakness  History is obtained from: Granddaughter  HPI: Yvonne Henderson is a 86 y.o. female with history of stroke and 01/2020, hypertension, glaucoma, diabetes, chronic kidney disease stage II.  Patient was seen at [facility] on 01/2020.  At that time patient had suffered a left PCA stroke.  Patient was then transferred to [facility] rehab and eating.  She was discharged home on 6/2.  On 6/5 her home health nurse called granddaughter and stated that she was unable to stay alone at her house.  At that time granddaughter quickly went up to get her grandmother.  At that time she was still having some memory problems from the previous stroke but did not notice anything abnormal other than her previous symptoms.  On Tuesday 6/8, granddaughter heard a large thump and patient had fallen to the floor.  They went to [place] long where she was noted to have a right broken arm.  On Thursday of last week patient was going to be transferred to a nursing home and was at that time while still at [facility] granddaughter noted more of a right facial droop, slurred speech, weakness on the right side and even worsening thought processes.  That night she was transferred to the nursing home and granddaughter did mention that she felt the symptoms were worse.  She did tell the PA there she states.  Unfortunately she is not brought to the hospital.  However from Thursday to today 03/06/2020 her symptoms seem to worsen and thus she was brought to the hospital today.  MRI was obtained and did show new strokes in the left PCA region.  Patient did have a full work-up for stroke while they were at [facility].  On discharge patient was started on 325 mg aspirin daily and Lipitor 40 mg daily.   LKW: 6/10 tpa given?: no, previous stroke Premorbid modified Rankin scale (mRS): 4 NIH stroke score: 18   ROS: A 14 point ROS was performed  and is negative except as noted in the HPI.    Past Medical History:  Diagnosis Date  . Anemia associated with chronic renal failure    Provided by family  . Benign essential HTN   . CKD (chronic kidney disease) stage 2, GFR 60-89 ml/min   . Diabetes mellitus without complication (HCC)   . Glaucoma   . Hypertension   . Osteopenia   . Stroke [facility])     Family History  Problem Relation Age of Onset  . Hypertension Mother   . Hypertension Father    Social History:   reports that she has never smoked. She has never used smokeless tobacco. She reports previous alcohol use. She reports previous drug use.  Medications No current facility-administered medications for this encounter.  Current Outpatient Medications:  .  amLODipine (NORVASC) 10 MG tablet, Take 10 mg by mouth daily., Disp: , Rfl:  .  atorvastatin (LIPITOR) 40 MG tablet, Take 40 mg by mouth at bedtime., Disp: , Rfl:  .  FERREX 150 150 MG capsule, Take 150 mg by mouth daily., Disp: , Rfl:  .  furosemide (LASIX) 20 MG tablet, Take 20 mg by mouth daily., Disp: , Rfl:  .  glipiZIDE (GLUCOTROL) 5 MG tablet, Take 2.5 mg by mouth 2 (two) times daily., Disp: , Rfl:  .  hydrochlorothiazide (HYDRODIURIL) 25 MG tablet, Take 25 mg by mouth daily., Disp: , Rfl:  .  LANTUS SOLOSTAR 100  UNIT/ML Solostar Pen, Inject 20 Units into the skin in the morning., Disp: , Rfl:  .  latanoprost (XALATAN) 0.005 % ophthalmic solution, Place 1 drop into both eyes daily., Disp: , Rfl:  .  levothyroxine (SYNTHROID) 88 MCG tablet, Take 88 mcg by mouth daily., Disp: , Rfl:  .  oxyCODONE-acetaminophen (PERCOCET/ROXICET) 5-325 MG tablet, Take 1 tablet by mouth every 6 (six) hours as needed for severe pain., Disp: 16 tablet, Rfl: 0 .  timolol (TIMOPTIC) 0.5 % ophthalmic solution, Place 1 drop into both eyes daily. , Disp: , Rfl:  .  Vitamin D, Ergocalciferol, (DRISDOL) 1.25 MG (50000 UNIT) CAPS capsule, Take 50,000 Units by mouth 2 (two) times a week., Disp: ,  Rfl:  .  potassium chloride SA (KLOR-CON) 20 MEQ tablet, Take 40 mEq by mouth once., Disp: , Rfl:    Exam: Current vital signs: BP 138/77   Pulse 73   Temp 98.4 F (36.9 C) (Oral)   Resp 18   SpO2 97%  Vital signs in last 24 hours: Temp:  [98.4 F (36.9 C)] 98.4 F (36.9 C) (06/15 1419) Pulse Rate:  [72-73] 73 (06/15 1245) Resp:  [14-18] 18 (06/15 1245) BP: (138-144)/(76-77) 138/77 (06/15 1245) SpO2:  [97 %-100 %] 97 % (06/15 1245)  GENERAL: Awake, alert in NAD HEENT: - Normocephalic and atraumatic, LUNGS - Clear to auscultation bilaterally with no wheezes CV - S1S2 RRR, ABDOMEN - Soft, nontender Ext: warm, well perfused  NEURO:  Mental Status: Alert, follows commands.slow thought process Language: speech is significantly dysarthric.  Cranial Nerves: PERRL 2 mm/brisk. EOMI, right hemianopsia, right facial droop, decreased right facial sensation intact, hearing severely decreased, tongue/uvula/soft palate midline,  No evidence of tongue atrophy or fibrillations Motor: Unable to test right arm secondary to fracture, left arm is 5/5, right leg unable to lift antigravity and left leg 4/5 Tone: is normal and bulk is normal Sensation-decreased on the right Coordination: FTN intact on the left,  Labs I have reviewed labs in epic and the results pertinent to this consultation are:  CBC    Component Value Date/Time   WBC 9.4 03/06/2020 1306   RBC 4.06 03/06/2020 1306   HGB 11.5 (L) 03/06/2020 1306   HCT 35.2 (L) 03/06/2020 1306   PLT 306 03/06/2020 1306   MCV 86.7 03/06/2020 1306   MCH 28.3 03/06/2020 1306   MCHC 32.7 03/06/2020 1306   RDW 13.5 03/06/2020 1306   LYMPHSABS 2.1 03/06/2020 1306   MONOABS 0.8 03/06/2020 1306   EOSABS 0.1 03/06/2020 1306   BASOSABS 0.1 03/06/2020 1306    CMP     Component Value Date/Time   NA 141 03/06/2020 1306   K 3.2 (L) 03/06/2020 1306   CL 101 03/06/2020 1306   CO2 30 03/06/2020 1306   GLUCOSE 105 (H) 03/06/2020 1306   BUN 18  03/06/2020 1306   CREATININE 0.96 03/06/2020 1306   CALCIUM 8.7 (L) 03/06/2020 1306   GFRNONAA 52 (L) 03/06/2020 1306   GFRAA >60 03/06/2020 1306   Previous work-up on 02/01/2020 showed-HbA1c 7.3, LDL 175, echocardiogram with left ventricular systolic function of 55-60%  Imaging I have reviewed the images obtained:  03/06/2020 MRI examination of the brain- background pattern of extensive chronic small vessel ischemic changes.  Changes of subacute infarction in the left PCA territory that were acute on 02/01/2020.  Acute extension of infarction in the left PCA territory, affecting areas of the brain not involved previously, including the left cerebral peduncle, more involvement in the  lateral thalamus, and more involvement in the splenium of corpus callosum.   02/01/2020 CT ANGIOGRAPHY HEAD AND NECK  COMPARISON: Head CT 01/31/2020 FINDINGS:  CT HEAD FINDINGS Brain: There is no mass, hemorrhage or extra-axial collection.  There is no acute or chronic infarction. There is hypoattenuation of the periventricular white matter  CTA NECK FINDINGS  RIGHT CAROTID SYSTEM: No dissection, occlusion or aneurysm. Mild atherosclerotic calcification at the carotid bifurcation without hemodynamically significant stenosis. LEFT CAROTID SYSTEM: No dissection, occlusion or aneurysm. Mild atherosclerotic calcification at the carotid bifurcation without hemodynamically significant stenosis. VERTEBRAL ARTERIES: Right dominant configuration. Both origins are clearly patent. There is no dissection, occlusion or flow-limiting stenosis to the skull base (V1-V3 segments).  CTA HEAD FINDINGS  IMPRESSION: 1. No emergent large vessel occlusion. 2. Short segment occlusion of the left posterior cerebral artery at the P1-P2 junction. Patent distally. 3. Diffuse atherosclerotic irregularity of the intracranial arteries. 4. Aortic Atherosclerosis (ICD10-I70.0). Electronically Signed By: Ulyses Jarred M.D. On:  02/01/2020 19:56     Assessment:  This is a 86 year old female who has had a previous left PCA stroke on 02/01/2020 and was taken care of at [facility].  Last Thursday patient was noted to have worsening of her symptoms which included significant dysarthria, weakness on the right side and decreased sensation on the right side.  Patient was brought to the hospital today for further evaluation of possible stroke.  MRI does show further acute infarcts in the right PCA distribution.  As patient has had a recent full stroke work-up no need to repeat all the testing but would require some tests as below.  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 -Carotid duplex and MRA head without contrast (do not want to give contrast either for CTA or MRA due to deranged renal function and advanced age) -Increase Lipitor to 80 mg daily -Do not need to repeat A1c or lipid panel as it was recently done -goal A1c less than 7 and goal LDL should be less than 70. -Stroke team to follow.  -- Amie Portland, MD [facility] Pager: [phone] If 7pm to 7am, please call on call as listed on AMION.   ADDENDUM Pt seen and examined. Brought in for increasing memory issues and confusion. Had recent stroke in the early part of May 2021, seen at [facility], in the left PCA territory with vascular imaging showing occlusion of the left P1/P2 segment.  Prior to stroke was independently living.  After the stroke, was in rehab and had gradual worsening over the last few weeks. At [facility], MR shows extension of the prior stroke in the left PCA territory, now involving genu of the corpus callosum as well as lateral thalamus and the occipital lobe Family unaware of antiplatelet treatment but medication list mentions aspirin  325-unclear if compliant to medications. Examination performed-agree with the above. Recommendations-as above-updated to reflect my advice. Stroke team to continue to follow.  -- Amie Portland, MD [facility] Pager: [phone] If 7pm to 7am, please call on call as listed on AMION.

## 2020-03-06 NOTE — ED Provider Notes (Signed)
[facility] EMERGENCY DEPARTMENT Provider Note   CSN: [id number] Arrival date & time: 03/06/20  1227     History Chief Complaint  Patient presents with  . Slurred Speech    Yvonne Henderson is a 86 y.o. female with a past medical history significant for hypertension, CKD stage II, diabetes, and history of stroke who presents to the ED due to slurred speech and right-sided deficits since last Thursday.  Granddaughter at bedside.  Last known normal last Wednesday.  Granddaughter notes that patient had a midshaft humeral fracture on 6/8 and was sent to [facility] following the accident for extra help. No deficits from previous CVA except for memory impairment.. Granddaughter also notes slurred speech associated with slower than normal speech. No fever or chills. No sick contacts or COVID exposures. She is not currently on any blood thinners. No known head injury.  Chart reviewed. Patient was admitted on 5/12 with CVA due occlusion of left posterior cerebral artery.   Level 5 caveat secondary to nonverbal status.  History obtained from granddaughter and past medical records. No interpreter used during encounter.      Past Medical History:  Diagnosis Date  . Anemia associated with chronic renal failure    Provided by family  . Benign essential HTN   . CKD (chronic kidney disease) stage 2, GFR 60-89 ml/min   . Diabetes mellitus without complication (HCC)   . Glaucoma   . Hypertension   . Osteopenia   . Stroke [facility])     Patient Active Problem List   Diagnosis Date Noted  . Anemia associated with chronic renal failure   . Glaucoma   . Osteopenia   . Benign essential HTN     Past Surgical History:  Procedure Laterality Date  . ABDOMINAL HYSTERECTOMY       OB History   No obstetric history on file.     History reviewed. No pertinent family history.  Social History   Tobacco Use  . Smoking status: Never Smoker  . Smokeless tobacco: Never  Used  Substance Use Topics  . Alcohol use: Not Currently  . Drug use: Not Currently    Home Medications Prior to Admission medications   Medication Sig Start Date End Date Taking? Authorizing Provider  amLODipine (NORVASC) 10 MG tablet Take 10 mg by mouth daily. 02/22/20  Yes [provider]  atorvastatin (LIPITOR) 40 MG tablet Take 40 mg by mouth at bedtime. 02/22/20  Yes [provider]  FERREX 150 150 MG capsule Take 150 mg by mouth daily. 12/14/19  Yes [provider]  furosemide (LASIX) 20 MG tablet Take 20 mg by mouth daily. 07/19/13  Yes [provider]  glipiZIDE (GLUCOTROL) 5 MG tablet Take 2.5 mg by mouth 2 (two) times daily. 02/22/20  Yes [provider]  hydrochlorothiazide (HYDRODIURIL) 25 MG tablet Take 25 mg by mouth daily. 01/13/20  Yes [provider]  LANTUS SOLOSTAR 100 UNIT/ML Solostar Pen Inject 20 Units into the skin in the morning. 12/13/19  Yes [provider]  latanoprost (XALATAN) 0.005 % ophthalmic solution Place 1 drop into both eyes daily. 01/16/20  Yes [provider]  levothyroxine (SYNTHROID) 88 MCG tablet Take 88 mcg by mouth daily. 01/16/20  Yes [provider]  oxyCODONE-acetaminophen (PERCOCET/ROXICET) 5-325 MG tablet Take 1 tablet by mouth every 6 (six) hours as needed for severe pain. 02/28/20  Yes Devoria Albe, MD  timolol (TIMOPTIC) 0.5 % ophthalmic solution Place 1 drop into  both eyes daily.  06/28/13  Yes [provider]  Vitamin D, Ergocalciferol, (DRISDOL) 1.25 MG (50000 UNIT) CAPS capsule Take 50,000 Units by mouth 2 (two) times a week. 12/13/19  Yes [provider]  potassium chloride SA (KLOR-CON) 20 MEQ tablet Take 40 mEq by mouth once. 03/06/20   [provider]    Allergies    Patient has no known allergies.  Review of Systems   Review of Systems  Unable to perform ROS: Patient nonverbal    Physical Exam Updated Vital Signs BP 138/77   Pulse 73    Temp 98.4 F (36.9 C) (Oral)   Resp 18   SpO2 97%   Physical Exam Vitals and nursing note reviewed.  Constitutional:      General: She is not in acute distress.    Appearance: She is not toxic-appearing.  HENT:     Head: Normocephalic.  Eyes:     Pupils: Pupils are equal, round, and reactive to light.  Cardiovascular:     Rate and Rhythm: Normal rate and regular rhythm.     Pulses: Normal pulses.     Heart sounds: Normal heart sounds. No murmur heard.  No friction rub. No gallop.   Pulmonary:     Effort: Pulmonary effort is normal.     Breath sounds: Normal breath sounds.  Abdominal:     General: Abdomen is flat. There is no distension.     Palpations: Abdomen is soft.     Tenderness: There is no abdominal tenderness. There is no guarding or rebound.  Musculoskeletal:     Cervical back: Neck supple.     Comments: Right arm in splint and sling.  Skin:    General: Skin is warm and dry.  Neurological:     General: No focal deficit present.     Mental Status: She is alert. She is disoriented.     Comments: No grip strength on right. Very minimal movement of RLE. Able to mumble a few words. Right visual field defect.      ED Results / Procedures / Treatments   Labs (all labs ordered are listed, but only abnormal results are displayed) Labs Reviewed  CBC WITH DIFFERENTIAL/PLATELET - Abnormal; Notable for the following components:      Result Value   Hemoglobin 11.5 (*)    HCT 35.2 (*)    All other components within normal limits  BASIC METABOLIC PANEL - Abnormal; Notable for the following components:   Potassium 3.2 (*)    Glucose, Bld 105 (*)    Calcium 8.7 (*)    GFR calc non Af Amer 52 (*)    All other components within normal limits  CBG MONITORING, ED - Abnormal; Notable for the following components:   Glucose-Capillary 101 (*)    All other components within normal limits  SARS CORONAVIRUS 2 BY RT PCR [facility] ORDER, PERFORMED IN [facility] LAB)    URINALYSIS, ROUTINE W REFLEX MICROSCOPIC    EKG EKG Interpretation  Date/Time:  Tuesday March 06 2020 12:32:13 EDT Ventricular Rate:  77 PR Interval:    QRS Duration: 86 QT Interval:  422 QTC Calculation: 478 R Axis:   -34 Text Interpretation: Sinus rhythm Inferior infarct, age indeterminate No prior ECG for comparison. No STEMI Confirmed by Theda Belfast ([id number]) on 03/06/2020 1:11:16 PM   Radiology MR BRAIN WO CONTRAST  Result Date: 03/06/2020 CLINICAL DATA:  Slurred speech beginning 5 days ago. EXAM: MRI HEAD WITHOUT CONTRAST TECHNIQUE: Multiplanar, multiecho  pulse sequences of the brain and surrounding structures were obtained without intravenous contrast. COMPARISON:  02/01/2020 FINDINGS: Brain: 1 month ago, the patient presented with acute infarction in the left PCA territory. Today's study shows some residual signal abnormality related to that acute infarction in May, but also shows presence of some extension of infarction in the vascular territory affecting areas of the brain not involved previously, most notably areas in the cerebral peduncle and thalamus and the splenium of the corpus callosum. No evidence of hemorrhage or mass effect. Chronic small-vessel ischemic changes again seen affecting the pons and cerebellum. Small-vessel changes throughout the thalami and hemispheric deep white matter. No mass, hemorrhage, hydrocephalus or extra-axial collection. Vascular: Major vessels at the base of the brain show flow. Skull and upper cervical spine: Negative Sinuses/Orbits: Clear/normal Other: None IMPRESSION: Background pattern of extensive chronic small-vessel ischemic changes throughout the brain. Changes of subacute infarction in the left PCA territory that were acute on 02/01/2020. Acute extension of infarction in the left PCA territory, affecting areas of the brain not involved previously, including the left cerebral peduncle, more involvement in the lateral thalamus, and more  involvement in the splenium of the corpus callosum. Electronically Signed   By: Nelson Chimes M.D.   On: 03/06/2020 14:22    Procedures Procedures (including critical care time)  Medications Ordered in ED Medications - No data to display  ED Course  I have reviewed the triage vital signs and the nursing notes.  Pertinent labs & imaging results that were available during my care of the patient were reviewed by me and considered in my medical decision making (see chart for details).  Clinical Course as of Mar 06 1545  Tue Mar 06, 2020  1252 Glucose-Capillary(!): 101 [CA]    Clinical Course User Index [CA] Karie Kirks   MDM Rules/Calculators/A&P                         86 year old female presents to the ED due to slurred speech since last Thursday.  Granddaughter at bedside. Patient recently had a CVA on 5/12 with no neurological deficits except memory impairment per granddaughter.  Stable vitals.  Patient in no acute distress and nontoxic-appearing.  Physical exam significant for right sided weakness.  Patient with very minimal communication.  Will obtain routine labs and UA to rule out infectious etiology.  Also obtain MRI of brain to rule out stroke.  Patient currently out of the stroke window given symptoms started last Thursday.  Discussed case with Dr. Sherry Ruffing who evaluated patient at bedside and agrees with assessment and plan.  BMP significant for mild hypokalemia at 3.2, but otherwise reassuring.  CBC reassuring with no leukocytosis.  No concern for hypoglycemia causing new neurological symptoms.  EKG personally reviewed which demonstrates normal sinus rhythm with no signs of acute ischemia. MRI personally reviewed which demonstrates: IMPRESSION:  Background pattern of extensive chronic small-vessel ischemic  changes throughout the brain.    Changes of subacute infarction in the left PCA territory that were  acute on 02/01/2020. Acute extension of infarction in the  left PCA  territory, affecting areas of the brain not involved previously,  including the left cerebral peduncle, more involvement in the  lateral thalamus, and more involvement in the splenium of the corpus  callosum.   Will consult unassigned medicine for medical admission. Dr Rory Percy with neurology agrees to evaluate patient at bedside for further evaluation.   Spoke to Dr. Sharon Seller  with internal medicine who agrees to admit patient for further evaluation. COVID test ordered.  Final Clinical Impression(s) / ED Diagnoses Final diagnoses:  Cerebrovascular accident (CVA) due to occlusion of left posterior cerebral artery [facility])    Rx / DC Orders ED Discharge Orders    None       Jesusita Oka 03/06/20 1546    Tegeler, Canary Brim, MD 03/07/20 1547

## 2020-03-06 NOTE — H&P (Signed)
Date: 03/06/2020               Patient Name:  Yvonne Henderson MRN: [id number]  DOB: 1926-12-08 Age / Sex: 86 y.o., female   PCP: Suzan Slick, MD         Medical Service: Internal Medicine Teaching Service         Attending Physician: Dr. Earl Lagos, MD    First Contact: Dr. Darl Pikes Pager: [phone]  Second Contact: Dr. Cleaster Corin Pager: [phone]       After Hours (After 5p/  First Contact Pager: [phone]  weekends / holidays): Second Contact Pager: [phone]   Chief Complaint: slurred speech and right sided weakness  History of Present Illness: Yvonne Henderson is a 86 yo F w/ a PMHx significant for a CVA in 01/2020, HTN, DM2, hypothyroidism, CKD II and anemia of chronic renal failure with MRI showing extension of prior stroke.  Patient seen in emergency room, confused, unable to answer questions. She is able to tell us her name. No other hx provided. Granddaughter contacted x 2 w/out answer.  HPI per chart review: pt with recent stroke back in May 2021 where she suffered from memory deficits. Discharged home where the patient fell and suffered a right midshaft humeral fracture for which she was admitted to [facility]. While admitted the patient's granddaughter noticed slurred and slowed speech as well as R sided weakness and numbness. She was discharged to a nursing home where her sx worsened resulting in her presentation today.   Meds:  Current Meds  Medication Sig  . amLODipine (NORVASC) 10 MG tablet Take 10 mg by mouth daily.  Marland Kitchen atorvastatin (LIPITOR) 40 MG tablet Take 40 mg by mouth at bedtime.  Marland Kitchen FERREX 150 150 MG capsule Take 150 mg by mouth daily.  . furosemide (LASIX) 20 MG tablet Take 20 mg by mouth daily.  Marland Kitchen glipiZIDE (GLUCOTROL) 5 MG tablet Take 2.5 mg by mouth 2 (two) times daily.  . hydrochlorothiazide (HYDRODIURIL) 25 MG tablet Take 25 mg by mouth daily.  Marland Kitchen LANTUS SOLOSTAR 100 UNIT/ML Solostar Pen Inject 20 Units into the skin in the morning.  . latanoprost  (XALATAN) 0.005 % ophthalmic solution Place 1 drop into both eyes daily.  Marland Kitchen levothyroxine (SYNTHROID) 88 MCG tablet Take 88 mcg by mouth daily.  Marland Kitchen oxyCODONE-acetaminophen (PERCOCET/ROXICET) 5-325 MG tablet Take 1 tablet by mouth every 6 (six) hours as needed for severe pain.  Marland Kitchen timolol (TIMOPTIC) 0.5 % ophthalmic solution Place 1 drop into both eyes daily.   . Vitamin D, Ergocalciferol, (DRISDOL) 1.25 MG (50000 UNIT) CAPS capsule Take 50,000 Units by mouth 2 (two) times a week.    Allergies: Allergies as of 03/06/2020  . (No Known Allergies)   Past Medical History:  Diagnosis Date  . Anemia associated with chronic renal failure    Provided by family  . Benign essential HTN   . CKD (chronic kidney disease) stage 2, GFR 60-89 ml/min   . Diabetes mellitus without complication (HCC)   . Glaucoma   . Hypertension   . Osteopenia   . Stroke [facility])     Past Surgical History:  Procedure Laterality Date  . ABDOMINAL HYSTERECTOMY      Family History:  Family History  Problem Relation Age of Onset  . Hypertension Mother   . Hypertension Father     Social History:  Social History   Tobacco Use  . Smoking status: Never Smoker  . Smokeless tobacco: Never Used  Substance Use Topics  . Alcohol use: Not Currently  . Drug use: Not Currently    Review of Systems: A complete ROS was negative except as per HPI.   Physical Exam: Blood pressure 138/77, pulse 73, temperature 98.4 F (36.9 C), temperature source Oral, resp. rate 18, SpO2 97 %. Physical Exam Vitals and nursing note reviewed.  Constitutional:      General: She is not in acute distress.    Comments: Elderly appearing  HENT:     Head: Normocephalic and atraumatic.     Mouth/Throat:     Mouth: Mucous membranes are moist.     Pharynx: Oropharynx is clear.  Cardiovascular:     Rate and Rhythm: Normal rate and regular rhythm.  Pulmonary:     Effort: Pulmonary effort is normal. No respiratory distress.     Breath  sounds: Normal breath sounds.  Abdominal:     General: Abdomen is flat.     Tenderness: There is no abdominal tenderness.  Musculoskeletal:        General: No swelling.     Comments: R arm in sling  Skin:    General: Skin is warm and dry.  Neurological:     Mental Status: She is alert.     Cranial Nerves: Cranial nerve deficit: possible L sided facial droop; patient unable to look inferiorly or superiorly, only looked medially and laterally but unclear if this is due to patient mentation.     Sensory: Sensory deficit: could't assess due to patient's mental status.     Motor: Weakness (unable to lift R arm and leg against gravity; unable to squeeze fingers with R hand; 4/5 strength on the L) present.     Comments: Only oriented to self; did not properly follow commands, unable to completely assess CNs    Labs: Results for orders placed or performed during the hospital encounter of 03/06/20 (from the past 24 hour(s))  CBG monitoring, ED     Status: Abnormal   Collection Time: 03/06/20 12:29 PM  Result Value Ref Range   Glucose-Capillary 101 (H) 70 - 99 mg/dL  CBC with Differential     Status: Abnormal   Collection Time: 03/06/20  1:06 PM  Result Value Ref Range   WBC 9.4 4.0 - 10.5 K/uL   RBC 4.06 3.87 - 5.11 MIL/uL   Hemoglobin 11.5 (L) 12.0 - 15.0 g/dL   HCT 82.5 (L) 36 - 46 %   MCV 86.7 80.0 - 100.0 fL   MCH 28.3 26.0 - 34.0 pg   MCHC 32.7 30.0 - 36.0 g/dL   RDW 05.3 97.6 - 73.4 %   Platelets 306 150 - 400 K/uL   nRBC 0.0 0.0 - 0.2 %   Neutrophils Relative % 67 %   Neutro Abs 6.4 1.7 - 7.7 K/uL   Lymphocytes Relative 22 %   Lymphs Abs 2.1 0.7 - 4.0 K/uL   Monocytes Relative 9 %   Monocytes Absolute 0.8 0 - 1 K/uL   Eosinophils Relative 1 %   Eosinophils Absolute 0.1 0 - 0 K/uL   Basophils Relative 1 %   Basophils Absolute 0.1 0 - 0 K/uL   Immature Granulocytes 0 %   Abs Immature Granulocytes 0.04 0.00 - 0.07 K/uL  Basic metabolic panel     Status: Abnormal    Collection Time: 03/06/20  1:06 PM  Result Value Ref Range   Sodium 141 135 - 145 mmol/L   Potassium 3.2 (L) 3.5 - 5.1 mmol/L  Chloride 101 98 - 111 mmol/L   CO2 30 22 - 32 mmol/L   Glucose, Bld 105 (H) 70 - 99 mg/dL   BUN 18 8 - 23 mg/dL   Creatinine, Ser 9.52 0.44 - 1.00 mg/dL   Calcium 8.7 (L) 8.9 - 10.3 mg/dL   GFR calc non Af Amer 52 (L) >60 mL/min   GFR calc Af Amer >60 >60 mL/min   Anion gap 10 5 - 15    EKG: personally reviewed my interpretation is no acute ischemic changes.   MR ANGIO HEAD WO CONTRAST  Result Date: 03/06/2020 CLINICAL DATA:  Slurred speech. Extension of left PCA territory stroke. EXAM: MRA HEAD WITHOUT CONTRAST TECHNIQUE: Angiographic images of the Circle of Willis were obtained using MRA technique without intravenous contrast. COMPARISON:  MRI same day FINDINGS: Both internal carotid arteries are patent through the skull base and siphon regions. On the right, the anterior and middle cerebral vessels show flow but appear to show diffuse atherosclerotic irregularity. 50% stenosis in the M1 M2 junction region. On the left, the anterior and middle cerebral vessels are patent. There is some distal vessel atherosclerotic irregularity, though not as severe as on the right. Both vertebral arteries are patent to the basilar with the right being dominant. Dominant right posteroinferior cerebellar artery. Both vertebral arteries reach the basilar. No basilar stenosis. Dominant left anterior inferior cerebellar artery. Both superior cerebellar arteries show flow. Right PCA is patent with pronounced atherosclerotic irregularity. Left PCA is occluded less than 1 cm beyond the origin. IMPRESSION: Left PCA occlusion. Diffuse intracranial atherosclerotic disease of the medium to small vessels as outlined above. Electronically Signed   By: Paulina Fusi M.D.   On: 03/06/2020 17:20   MR BRAIN WO CONTRAST  Result Date: 03/06/2020 CLINICAL DATA:  Slurred speech beginning 5 days ago.  EXAM: MRI HEAD WITHOUT CONTRAST TECHNIQUE: Multiplanar, multiecho pulse sequences of the brain and surrounding structures were obtained without intravenous contrast. COMPARISON:  02/01/2020 FINDINGS: Brain: 1 month ago, the patient presented with acute infarction in the left PCA territory. Today's study shows some residual signal abnormality related to that acute infarction in May, but also shows presence of some extension of infarction in the vascular territory affecting areas of the brain not involved previously, most notably areas in the cerebral peduncle and thalamus and the splenium of the corpus callosum. No evidence of hemorrhage or mass effect. Chronic small-vessel ischemic changes again seen affecting the pons and cerebellum. Small-vessel changes throughout the thalami and hemispheric deep white matter. No mass, hemorrhage, hydrocephalus or extra-axial collection. Vascular: Major vessels at the base of the brain show flow. Skull and upper cervical spine: Negative Sinuses/Orbits: Clear/normal Other: None IMPRESSION: Background pattern of extensive chronic small-vessel ischemic changes throughout the brain. Changes of subacute infarction in the left PCA territory that were acute on 02/01/2020. Acute extension of infarction in the left PCA territory, affecting areas of the brain not involved previously, including the left cerebral peduncle, more involvement in the lateral thalamus, and more involvement in the splenium of the corpus callosum. Electronically Signed   By: Paulina Fusi M.D.   On: 03/06/2020 14:22    Assessment:  Ms. Tropea is a 86 yo F w/ a PMHx significant for a CVA in 01/2020, HTN, DM2, hypothyroidism, CKD II and anemia of chronic renal failure with MRI showing extension of prior stroke.  Plan by Problem: Slurred Speech/Right Sided Weakness -pt w/ recent CVA and unclear adherence to ASA presenting with a week of  slurred and slowed speech as well as worsening R sided weakness and  numbness -MRI demonstrates extension of prior stroke/new strokes in the left PCA region -neurology consulted and recommending dual antiplatelet therapy w/ ASA/Plavix, carotid US, MRA head w/ out and increased dosage of lipitor to 80 mg daily -pt HDS and in no acute distress on exam -have attempted to contact granddaughter x2 without answer  Plan: -fu neuro recs -fu MRA and Carotid US -continue ASA/plavix -continue lipitor 80 mg daily -fu pt/ptt -fu mag -fu TSH -fu UA  Midshaft Humeral Fracture:  -not currently complaining of pain -tylenol as needed  -can restart home percocet if tylenol not providing sufficient relief  Hypokalemia: -K 3.2 on admission -on lasix so this is likely a side effect of that  Plan: -IV K 10 mg x4 -check and replete as needed  HTN: -on lasix, amlodipine and HCTZ at home -BP stable here  Plan: -currently holding home meds  DM2: -last a1c 7.3 in May -glucose 105 on admission -on glipizide and lantus at home  Plan: -SSI inpatient  Hypothyroidism: -continue home synthroid -fu TSH  CKD II: -Creatinine stable at 0.96  Plan: -avoid nephrotoxic meds -trend BMP  Anemia of CKD: -hgb 11.5 on admission -down from 12.5 back in May  -no acute blood loss  Plan: -trend CBC  Active Problems:   CVA (cerebral vascular accident) ([place])   Dispo: Admit patient to Inpatient with expected length of stay greater than 2 midnights.  Signed: Al Decant, MD 03/06/2020, 6:10 PM  Pager: [phone]

## 2020-03-06 NOTE — Progress Notes (Signed)
Pt admitted to 4N16 at this time.  Alert to self and place.  Denies pain or discomfort at present.  Bed alarm on and call bell within reach.

## 2020-03-07 ENCOUNTER — Inpatient Hospital Stay (HOSPITAL_COMMUNITY): Payer: Medicare PPO

## 2020-03-07 DIAGNOSIS — I10 Essential (primary) hypertension: Secondary | ICD-10-CM

## 2020-03-07 DIAGNOSIS — I639 Cerebral infarction, unspecified: Secondary | ICD-10-CM

## 2020-03-07 DIAGNOSIS — E7800 Pure hypercholesterolemia, unspecified: Secondary | ICD-10-CM

## 2020-03-07 DIAGNOSIS — E1159 Type 2 diabetes mellitus with other circulatory complications: Secondary | ICD-10-CM

## 2020-03-07 DIAGNOSIS — I63432 Cerebral infarction due to embolism of left posterior cerebral artery: Principal | ICD-10-CM

## 2020-03-07 LAB — CBC WITH DIFFERENTIAL/PLATELET
Abs Immature Granulocytes: 0.02 10*3/uL (ref 0.00–0.07)
Basophils Absolute: 0.1 10*3/uL (ref 0.0–0.1)
Basophils Relative: 1 %
Eosinophils Absolute: 0.1 10*3/uL (ref 0.0–0.5)
Eosinophils Relative: 1 %
HCT: 32.8 % — ABNORMAL LOW (ref 36.0–46.0)
Hemoglobin: 10.8 g/dL — ABNORMAL LOW (ref 12.0–15.0)
Immature Granulocytes: 0 %
Lymphocytes Relative: 24 %
Lymphs Abs: 1.9 10*3/uL (ref 0.7–4.0)
MCH: 28.1 pg (ref 26.0–34.0)
MCHC: 32.9 g/dL (ref 30.0–36.0)
MCV: 85.4 fL (ref 80.0–100.0)
Monocytes Absolute: 0.7 10*3/uL (ref 0.1–1.0)
Monocytes Relative: 9 %
Neutro Abs: 5.1 10*3/uL (ref 1.7–7.7)
Neutrophils Relative %: 65 %
Platelets: 302 10*3/uL (ref 150–400)
RBC: 3.84 MIL/uL — ABNORMAL LOW (ref 3.87–5.11)
RDW: 13.5 % (ref 11.5–15.5)
WBC: 7.8 10*3/uL (ref 4.0–10.5)
nRBC: 0 % (ref 0.0–0.2)

## 2020-03-07 LAB — TSH: TSH: 2.108 u[IU]/mL (ref 0.350–4.500)

## 2020-03-07 LAB — COMPREHENSIVE METABOLIC PANEL
ALT: 14 U/L (ref 0–44)
AST: 21 U/L (ref 15–41)
Albumin: 2.9 g/dL — ABNORMAL LOW (ref 3.5–5.0)
Alkaline Phosphatase: 43 U/L (ref 38–126)
Anion gap: 8 (ref 5–15)
BUN: 18 mg/dL (ref 8–23)
CO2: 28 mmol/L (ref 22–32)
Calcium: 8.8 mg/dL — ABNORMAL LOW (ref 8.9–10.3)
Chloride: 103 mmol/L (ref 98–111)
Creatinine, Ser: 0.8 mg/dL (ref 0.44–1.00)
GFR calc Af Amer: >60 mL/min (ref >60)
GFR calc non Af Amer: >60 mL/min (ref >60)
Glucose, Bld: 127 mg/dL — ABNORMAL HIGH (ref 70–99)
Potassium: 3.3 mmol/L — ABNORMAL LOW (ref 3.5–5.1)
Sodium: 139 mmol/L (ref 135–145)
Total Bilirubin: 1 mg/dL (ref 0.3–1.2)
Total Protein: 6 g/dL — ABNORMAL LOW (ref 6.5–8.1)

## 2020-03-07 LAB — GLUCOSE, CAPILLARY
Glucose-Capillary: 73 mg/dL (ref 70–99)
Glucose-Capillary: 122 mg/dL — ABNORMAL HIGH (ref 70–99)
Glucose-Capillary: 188 mg/dL — ABNORMAL HIGH (ref 70–99)
Glucose-Capillary: 78 mg/dL (ref 70–99)
Glucose-Capillary: 218 mg/dL — ABNORMAL HIGH (ref 70–99)

## 2020-03-07 LAB — MAGNESIUM: Magnesium: 1.7 mg/dL (ref 1.7–2.4)

## 2020-03-07 MED ORDER — POTASSIUM CHLORIDE CRYS ER 20 MEQ PO TBCR
20.0000 meq | EXTENDED_RELEASE_TABLET | Freq: Two times a day (BID) | ORAL | Status: AC
  Administered 2020-03-07 (×2): 20 meq via ORAL
  Filled 2020-03-07 (×2): qty 1

## 2020-03-07 MED ORDER — INSULIN ASPART 100 UNIT/ML ~~LOC~~ SOLN
3.0000 [IU] | Freq: Once | SUBCUTANEOUS | Status: AC
  Administered 2020-03-07: 3 [IU] via SUBCUTANEOUS

## 2020-03-07 MED ORDER — ENOXAPARIN SODIUM 40 MG/0.4ML ~~LOC~~ SOLN
40.0000 mg | SUBCUTANEOUS | Status: DC
  Administered 2020-03-07 – 2020-03-08 (×2): 40 mg via SUBCUTANEOUS
  Filled 2020-03-07 (×2): qty 0.4

## 2020-03-07 MED ORDER — INSULIN ASPART 100 UNIT/ML ~~LOC~~ SOLN
0.0000 [IU] | Freq: Three times a day (TID) | SUBCUTANEOUS | Status: DC
  Administered 2020-03-08 (×2): 3 [IU] via SUBCUTANEOUS
  Administered 2020-03-09 (×2): 2 [IU] via SUBCUTANEOUS

## 2020-03-07 MED ORDER — INSULIN GLARGINE 100 UNIT/ML ~~LOC~~ SOLN
10.0000 [IU] | Freq: Every day | SUBCUTANEOUS | Status: DC
  Administered 2020-03-07 – 2020-03-08 (×2): 10 [IU] via SUBCUTANEOUS
  Filled 2020-03-07 (×3): qty 0.1

## 2020-03-07 MED ORDER — INSULIN ASPART 100 UNIT/ML ~~LOC~~ SOLN: 0.0000 [IU] | Freq: Three times a day (TID) | SUBCUTANEOUS | Status: DC

## 2020-03-07 NOTE — Progress Notes (Signed)
Date: 03/07/2020  Patient name: Yvonne Henderson  Medical record number: [id number]  Date of birth: 19-Apr-1927   I have seen and evaluated Yvonne Henderson and discussed their care with the Residency Team.  In brief, patient is a 86 year old female with a past medical history significant for a left PCA CVA in May 2021, hypertension, type 2 diabetes, hypothyroidism, CKD stage II and anemia of chronic disease who presented to the ED with increasing slurred speech and weakness on the right side and increased confusion over the last 3 to 4 days.  History obtained from chart as patient is unable to provide a good history at this time.  Per chart, patient had a recent left PCA CVA in May 2021 for which she was treated at [facility] and discharged to a rehab.  Patient was discharged home from the rehab on June 2 but had a fall on June 8 and was taken to [facility] where she was diagnosed to have a right humeral fracture and discharged to an SNF.  Over the last 3 to 4 days patient granddaughter had noted the patient appeared to have worsening right-sided weakness, slurred speech and increased confusion and was brought to the ED for further evaluation yesterday.  MRI done in the ED showed extension of her left PCA stroke and she was admitted for further evaluation.  No chest pain, no shortness of breath, no palpitations, no lightheadedness, no syncope, no nausea or vomiting, no abdominal pain, no fevers or chills, no diarrhea.  Today patient appears more alert but remains confused as to time and place.  PMHx, Fam Hx, and/or Soc Hx : As per resident admit note  Vitals:   03/07/20 0316 03/07/20 0822  BP: 134/78 (!) 139/92  Pulse: 81 75  Resp: 19 18  Temp: 98.2 F (36.8 C) 99 F (37.2 C)  SpO2: 100% 100%   General: Awake, alert, oriented x1 (oriented to self but not time or place), NAD CVs: Regular in rhythm, normal heart sounds Lungs: CTA bilaterally Abdomen: Soft, nontender, nondistended,  normoactive bowel sounds Extremities: Right arm in sling, no peripheral edema noted Psych: Normal mood and affect HEENT: Normocephalic, atraumatic Neuro: Power unable to be assessed in right upper extremity secondary to her humeral fracture but is 1-2+ in her right lower extremity (unable to lift right lower extremity against gravity but able to wiggle toes), power in her left upper and lower extremities are 5 out of 5, sensation is intact, oriented x1 but able to follow commands, dysarthric speech, tongue is midline, no evidence of tongue fasciculations  Assessment and Plan: I have seen and evaluated the patient as outlined above. I agree with the formulated Assessment and Plan as detailed in the residents' note, with the following changes:   1.  Left PCA CVA: -Patient presented to the ED with worsening right-sided weakness, increased confusion and slurred speech and was found to have extension of her prior left PCA stroke. -Per discussion with her family at bedside it is uncertain if patient was taking her aspirin at home (on discharge from SNF it had aspirin as an OTC med and it is uncertain if this was brought from the store for her) -Neuro follow-up and recommendations appreciated.  Stroke team to follow-up today -MRI/MRA results noted and consistent with a left PCA CVA -Continue with aspirin and Plavix for now -Monitor blood sugars here and continue with insulin sliding scale for now -SLP follow-up and recommendations appreciated.  Continue with dysphagia 3 diet per SLP  recommendations -We will follow PT/OT evaluation -Continue high intensity statin -We will follow-up carotid ultrasound -We will allow permissive hypertension for now -No further work-up at this time  Aldine Contes, MD 6/16/202112:37 PM

## 2020-03-07 NOTE — Evaluation (Signed)
Occupational Therapy Evaluation Patient Details Name: Yvonne Henderson MRN: [id number] DOB: 12-13-1926 Today's Date: 03/07/2020    History of Present Illness Pt is a 86 y.o. female with PMH of benign HTN, CVA. type 2 DM. hypothyroidism, dementia, osteopenia, anemia of chronic renal failure, glaucoma, and anemia presented to ED from [facility] after previous admission of CVA and fall which resulted in humeral shaft fx. Brought to ED after slurred appech and cognition changes. MRI reveals acute extension of infarction in the left PCA territory.   Clinical Impression   PTA pt was living in SNF with 24 hour assistance. Pt was admitted for above and treated for problem list below. Pt with known hx of dementia. A&Ox1 disoriented to time, place, and situation. Requires multimodal cues for one step commands, difficulty sequencing, memory, and for decreased awareness of deficits. Requires Total A +2 - Max A with ADLs due to cognitive deficits, decreased stength, hemiparesis, decreased balance, and NWB in dominant R UE. Right inattention noted with left gaze preference and multimodal cues needed to look to R side - will further assess perception and vision next session. Pt needed multimodal cues for sequencing donning gown with sling and splint. Requires mod-max A +2 with bed mobility and min-mod A with sitting balance EOB. Pt reported dizziness upon sitting up and fatigue with session VSS throughout session. Believe pt would benefit from skilled OT services acutely and at the SNF level to increase performance in ADLs and return to PLOF.     Follow Up Recommendations  SNF;Supervision/Assistance - 24 hour    Equipment Recommendations  Other (comment) (TBD at next venue of care)    Recommendations for Other Services       Precautions / Restrictions Precautions Precautions: Fall;Shoulder (Arm) Type of Shoulder Precautions: NWB splint in sling  Shoulder Interventions: Shoulder  sling/immobilizer;At all times Precaution Booklet Issued: No Precaution Comments: humeral fx RUE Required Braces or Orthoses: Sling;Splint/Cast Restrictions Weight Bearing Restrictions: Yes RUE Weight Bearing: Non weight bearing Other Position/Activity Restrictions: 30 degrees HOB due to risk of aspiration      Mobility Bed Mobility Overal bed mobility: Needs Assistance Bed Mobility: Supine to Sit;Sit to Supine;Rolling Rolling: +2 for physical assistance;+2 for safety/equipment;Mod assist (pt able to hold bed rail to assist with LUE)   Supine to sit: HOB elevated;Max assist;+2 for safety/equipment;+2 for physical assistance; Sit to supine: HOB elevated;+2 for physical assistance;+2 for safety/equipment;Max Assist;Total Assist   General bed mobility comments: Supine<>sit Max A +2 due to patient being able to assist being able to grab bed rail with L UE and able to lift L LE. Max-Total Assist +2 with sit<>supine due to increased fatigue.   Transfers Overall transfer level:  (deferred)                    Balance Overall balance assessment: History of Falls;Needs assistance Sitting-balance support: Single extremity supported;Feet supported;No upper extremity supported Sitting balance-Leahy Scale: Poor Sitting balance - Comments: drowsy upon sitting up. needed min A when using single extremity but mod A when no extremities supported Postural control: Posterior lean;Right lateral lean                                 ADL either performed or assessed with clinical judgement   ADL Overall ADL's : Needs assistance/impaired     Grooming: Cueing for sequencing;Maximal assistance;Bed level Grooming Details (indicate cue type and reason): Max  A with verbal cues for sequencing due to cognitive deficits and unable to perform with dominant hand  Upper Body Bathing: Maximal assistance;Bed level Upper Body Bathing Details (indicate cue type and reason): Max A at bed level  due to decreased problem solving and unable to functionally use R UE  Lower Body Bathing: +2 for physical assistance;Bed level;Total assistance Lower Body Bathing Details (indicate cue type and reason): Total A +2 due to weakness, cognition, balance and nonfunctional use of R UE and decreased strength and coordination in R LE Upper Body Dressing : Maximal assistance;Sitting;Cueing for sequencing Upper Body Dressing Details (indicate cue type and reason): Max A due to splint management and decreased cognition needing verbal cues for sequencing Lower Body Dressing: Total assistance;+2 for physical assistance;Bed level Lower Body Dressing Details (indicate cue type and reason): Total A due to cognitive deficits and splinted R UE and nonfuncitonal use of R LE Toilet Transfer: Deferred due to safety Toileting- Clothing Manipulation and Hygiene: Maximal assistance;Bed level;+2 for physical assistance Toileting - Clothing Manipulation Details (indicate cue type and reason): NWB status of R UE which is dominant hand, decreased balance and strength   Tub/Shower Transfer Details (indicate cue type and reason): deferred due to safety Functional mobility during ADLs: +2 for physical assistance;Cueing for sequencing;Maximal assistance General ADL Comments: Total A - Max A due to cognitive deficits, decreased stength, hemiparesis, decreased balance and NWB in dominant R UE.     Vision  Vision Assessment?: Yes Eye Alignment: Within Functional Limits Ocular Range of Motion: Within Functional Limits Alignment/Gaze Preference: Within Defined Limits Visual Fields: No apparent deficits   To be assess further next session. Potential R inattention needing multimodal cues to look to R side - to be assessed further next session.    Perception  To be assessed further next session.   Praxis  To be assessed further next session.     Pertinent Vitals/Pain Pain Assessment: Faces Faces Pain Scale: No hurt      Hand Dominance Right   Extremity/Trunk Assessment Upper Extremity Assessment Upper Extremity Assessment: Generalized weakness RUE Deficits / Details:  In splint from proximal humerus to PIP in digits with sling - unable to wiggle fingers and questionable sensation in digits.    Lower Extremity Assessment Lower Extremity Assessment: Defer to PT evaluation   Cervical / Trunk Assessment Cervical / Trunk Assessment: Kyphotic   Communication Communication Communication: Expressive difficulties;Receptive difficulties;HOH   Cognition Arousal/Alertness: Lethargic Behavior During Therapy: Flat affect Overall Cognitive Status: History of cognitive impairments - at baseline Area of Impairment: Orientation;Attention;Memory;Following commands;Safety/judgement;Awareness;Problem solving                 Orientation Level: Disoriented to;Place;Time;Situation Current Attention Level: Focused Memory: Decreased recall of precautions;Decreased short-term memory Following Commands: Follows one step commands inconsistently Safety/Judgement: Decreased awareness of deficits Awareness: Intellectual Problem Solving: Slow processing;Difficulty sequencing;Decreased initiation;Requires verbal cues;Requires tactile cues General Comments: Pt with PMH of dementia. A&Ox1 needing multimodal cues to follow commands. Inconsistently followed 1-step commands   General Comments  Pt reported dizziness upon sitting up and fatigue with session VSS throughout session.            Home Living Family/patient expects to be discharged to:: Skilled nursing facility   Available Help at Discharge: Skilled Nursing Facility Type of Home: Skilled Nursing Facility                           Additional Comments: plans to be  dc to [place]      Prior Functioning/Environment Level of Independence: Needs assistance  Gait / Transfers Assistance Needed: per chart review, before pt  transferred to SNF, needed assist with ambulation ADL's / Homemaking Assistance Needed: with help            OT Problem List: Decreased strength;Decreased range of motion;Decreased activity tolerance;Impaired balance (sitting and/or standing);Decreased cognition;Decreased safety awareness;Decreased knowledge of precautions;Impaired UE functional use;Decreased coordination;Impaired vision/perception;Decreased knowledge of use of DME or AE      OT Treatment/Interventions: Self-care/ADL training;Therapeutic exercise;Energy conservation;Neuromuscular education;Manual therapy;Splinting;Therapeutic activities;Cognitive remediation/compensation;Patient/family education;Balance training;DME and/or AE instruction;Visual/perceptual remediation/compensation    OT Goals(Current goals can be found in the care plan section) Acute Rehab OT Goals Patient Stated Goal: per granddaughter , to be back to her baseline OT Goal Formulation: With patient/family Time For Goal Achievement: 03/21/20 Potential to Achieve Goals: Fair ADL Goals Pt Will Perform Grooming: bed level;with min assist Pt Will Perform Upper Body Dressing: with mod assist;sitting Pt Will Transfer to Toilet: with max assist;bedside commode;stand pivot transfer Pt/caregiver will Perform Home Exercise Program: Both right and left upper extremity;With theraband;With theraputty;With written HEP provided;With Supervision Additional ADL Goal #1: Patient will follow one step command consistently 90% of the time to increase participation in ADLs.  OT Frequency: Min 2X/week   Barriers to D/C:            Co-evaluation PT/OT/SLP Co-Evaluation/Treatment: Yes Reason for Co-Treatment: For patient/therapist safety;To address functional/ADL transfers   OT goals addressed during session: ADL's and self-care      AM-PAC OT "6 Clicks" Daily Activity     Outcome Measure Help from another person eating meals?: A Lot Help from another person taking  care of personal grooming?: A Lot Help from another person toileting, which includes using toliet, bedpan, or urinal?: Total Help from another person bathing (including washing, rinsing, drying)?: Total Help from another person to put on and taking off regular upper body clothing?: Total Help from another person to put on and taking off regular lower body clothing?: Total 6 Click Score: 8   End of Session Nurse Communication: Mobility status  Activity Tolerance: Patient limited by fatigue Patient left: in bed;with bed alarm set;with call bell/phone within reach;with family/visitor present;with nursing/sitter in room  OT Visit Diagnosis: Muscle weakness (generalized) (M62.81);History of falling (Z91.81);Other symptoms and signs involving the nervous system (R29.898);Other symptoms and signs involving cognitive function;Dizziness and giddiness (R42);Hemiplegia and hemiparesis Hemiplegia - Right/Left: Right Hemiplegia - dominant/non-dominant: Dominant Hemiplegia - caused by: Cerebral infarction                Time: 5956-3875 OT Time Calculation (min): 28 min Charges:  OT General Charges $OT Visit: 1 Visit OT Evaluation $OT Eval High Complexity: 1 High  Nejla Reasor/OTS  Irasema Chalk 03/07/2020, 3:07 PM

## 2020-03-07 NOTE — Evaluation (Signed)
Physical Therapy Evaluation Patient Details Name: Yvonne Henderson MRN: [id number] DOB: March 13, 1927 Today's Date: 03/07/2020   History of Present Illness  Pt is a 86 y.o. female with PMH of benign HTN, CVA. type 2 DM. hypothyroidism, dementia, osteopenia, anemia of chronic renal failure, glaucoma, and anemia presented to ED from [facility] after previous admission of CVA and fall which resulted in humeral shaft fx. Brought to ED after slurred speech and cognition changes. MRI reveals acute extension of infarction in the left PCA territory.  Clinical Impression  Pt presents with an overall decrease in functional mobility, generalized weakness, right sided hemiparesis, and decrease in balance secondary to above. PTA, pt was at [facility]. Today, pt able to complete bed mobility up to max(A) and required up to mod(A) to maintain sitting balance at EOB. Pt required verbal and tactile cues for mobility for command following and safety. Pt grandchildren present during session, pt and grandchildren request to return to SNF. Recommending SNF upon d/c. Pt would benefit from continued acute PT services to maximize functional mobility and independence prior to d/c to next venue of care.    Follow Up Recommendations SNF;Supervision/Assistance - 24 hour    Equipment Recommendations  Other (comment) (defer to next venue of care)    Recommendations for Other Services       Precautions / Restrictions Precautions Precautions: Fall;Shoulder Type of Shoulder Precautions: NWB splint in sling  Shoulder Interventions: Shoulder sling/immobilizer;At all times Precaution Booklet Issued: No Precaution Comments: humeral fx RUE Required Braces or Orthoses: Sling;Splint/Cast Restrictions Weight Bearing Restrictions: Yes RUE Weight Bearing: Non weight bearing Other Position/Activity Restrictions: 30 degrees HOB due to risk of aspiration      Mobility  Bed Mobility Overal bed mobility: Needs Assistance Bed  Mobility: Supine to Sit;Sit to Supine;Rolling Rolling: +2 for physical assistance;+2 for safety/equipment;Mod assist   Supine to sit: HOB elevated;Max assist;+2 for safety/equipment;+2 for physical assistance;Mod assist Sit to supine: HOB elevated;+2 for physical assistance;+2 for safety/equipment;Max assist;Mod assist   General bed mobility comments: Pt required max A for initation of trunk elevation and LE movement to EOB +2. Required mod(A) after initation to complete the movement.  Transfers Overall transfer level:  (deferred)               General transfer comment: Pt not transfered due to safety, may attempt squat pivot next session  Ambulation/Gait             General Gait Details: unable  Stairs            Wheelchair Mobility    Modified Rankin (Stroke Patients Only) Modified Rankin (Stroke Patients Only) Pre-Morbid Rankin Score: Moderately severe disability Modified Rankin: Severe disability     Balance Overall balance assessment: History of Falls;Needs assistance Sitting-balance support: Single extremity supported;Feet supported;No upper extremity supported Sitting balance-Leahy Scale: Poor Sitting balance - Comments: drowsy upon sitting up. needed min A when using single extremity but mod A when no extremities supported Postural control: Posterior lean;Right lateral lean   Standing balance-Leahy Scale: Zero Standing balance comment: unable to test, infered                             Pertinent Vitals/Pain Pain Assessment: Faces Faces Pain Scale: No hurt    Home Living Family/patient expects to be discharged to:: Skilled nursing facility Living Arrangements: Other relatives Available Help at Discharge: [place] Type of Home: [place]  Home Equipment: Walker - 2 wheels Additional Comments: plans to be dc to [facility]    Prior Function Level of Independence: Needs assistance    Gait / Transfers Assistance Needed: per chart review, before pt transferred to SNF, needed assist with ambulation  ADL's / Homemaking Assistance Needed: with help        Hand Dominance   Dominant Hand: Right    Extremity/Trunk Assessment   Upper Extremity Assessment Upper Extremity Assessment: Defer to OT evaluation RUE Deficits / Details: in a slpint and sling    Lower Extremity Assessment Lower Extremity Assessment: RLE deficits/detail;LLE deficits/detail RLE Deficits / Details: trace movement in R hip extensors, abductors with moving LE to side of bed, unable to complete knee flexion/extension, unable to PF/DF ankle. LLE Deficits / Details: at least 3/5 grossly throughout    Cervical / Trunk Assessment Cervical / Trunk Assessment: Kyphotic  Communication   Communication: HOH  Cognition Arousal/Alertness: Lethargic Behavior During Therapy: Flat affect Overall Cognitive Status: History of cognitive impairments - at baseline Area of Impairment: Orientation;Attention;Memory;Following commands;Safety/judgement;Awareness;Problem solving                 Orientation Level: Disoriented to;Place;Time;Situation Current Attention Level: Focused Memory: Decreased recall of precautions;Decreased short-term memory Following Commands: Follows one step commands inconsistently Safety/Judgement: Decreased awareness of deficits Awareness: Intellectual Problem Solving: Slow processing;Difficulty sequencing;Decreased initiation;Requires verbal cues;Requires tactile cues General Comments: Pt with PMH of dementia. A&Ox1 needing multimodal cues to follow commands. Inconsistently followed 1-step commands, required tactile and verbal cues for mobility and command following.      General Comments General comments (skin integrity, edema, etc.): Pt reported dizziness upon sitting up and fatigue with session, montiored BP during session, VSS remained stable with mobility changes     Exercises     Assessment/Plan    PT Assessment Patient needs continued PT services  PT Problem List Decreased strength;Decreased cognition;Decreased knowledge of precautions;Decreased range of motion;Decreased mobility;Decreased knowledge of use of DME;Decreased activity tolerance;Decreased safety awareness;Decreased balance       PT Treatment Interventions DME instruction;Gait training;Functional mobility training;Therapeutic exercise;Therapeutic activities;Patient/family education    PT Goals (Current goals can be found in the Care Plan section)  Acute Rehab PT Goals Patient Stated Goal: go back to SNF PT Goal Formulation: With patient/family Time For Goal Achievement: 03/21/20 Potential to Achieve Goals: Good    Frequency Min 3X/week   Barriers to discharge   no barriers to d/c, plan according to family is to return to SNF    Co-evaluation PT/OT/SLP Co-Evaluation/Treatment: Yes Reason for Co-Treatment: For patient/therapist safety;To address functional/ADL transfers   OT goals addressed during session: ADL's and self-care       AM-PAC PT "6 Clicks" Mobility  Outcome Measure Help needed turning from your back to your side while in a flat bed without using bedrails?: Total Help needed moving from lying on your back to sitting on the side of a flat bed without using bedrails?: Total Help needed moving to and from a bed to a chair (including a wheelchair)?: Total Help needed standing up from a chair using your arms (e.g., wheelchair or bedside chair)?: Total Help needed to walk in hospital room?: Total Help needed climbing 3-5 steps with a railing? : Total 6 Click Score: 6    End of Session   Activity Tolerance: Treatment limited secondary to medical complications (Comment) Patient left: in bed;with call bell/phone within reach;with family/visitor present;with bed alarm set Nurse Communication: Mobility status PT Visit Diagnosis:  Unsteadiness on feet (R26.81);Repeated  falls (R29.6);Difficulty in walking, not elsewhere classified (R26.2);Other symptoms and signs involving the nervous system (R29.898);Hemiplegia and hemiparesis Hemiplegia - Right/Left: Right Hemiplegia - dominant/non-dominant: Dominant    Time: 3845-3646 PT Time Calculation (min) (ACUTE ONLY): 27 min   Charges:   PT Evaluation $PT Eval Moderate Complexity: 1 Mod         Mohamadou Maciver SPT 03/07/2020   Sanjuana Letters 03/07/2020, 4:48 PM

## 2020-03-07 NOTE — Progress Notes (Signed)
Carotid duplex and lower extremity venous bilateral study completed.   See Cv Proc for preliminary results.   Yvonne Henderson

## 2020-03-07 NOTE — Progress Notes (Addendum)
Subjective:  She is feeling much better this morning. Her energy has improved. She is difficult to understand due to her dysarthria. She is oriented to self only. Family members present in the room, all questions and concerns were addressed.    Objective:  Vital signs in last 24 hours: Vitals:   03/06/20 2100 03/06/20 2301 03/07/20 0022 03/07/20 0316  BP: (!) 143/71 (!) 161/81 (!) 141/83 134/78  Pulse: 79 82 77 81  Resp: 18 16 16 19   Temp: 98.2 F (36.8 C) 98.1 F (36.7 C) 97.9 F (36.6 C) 98.2 F (36.8 C)  TempSrc: Oral Oral Oral Oral  SpO2: 100% 92% 100% 100%   Physical Exam Constitutional:      General: She is not in acute distress.    Appearance: Normal appearance. She is not ill-appearing or toxic-appearing.     Comments: Resting comfortably in bed, able to answer some questions, oriented to self.    HENT:     Head: Normocephalic and atraumatic.  Cardiovascular:     Rate and Rhythm: Normal rate and regular rhythm.     Pulses: Normal pulses.     Heart sounds: Normal heart sounds. No murmur heard.  No friction rub. No gallop.   Pulmonary:     Effort: Pulmonary effort is normal.     Breath sounds: Normal breath sounds. No wheezing, rhonchi or rales.  Abdominal:     General: Abdomen is flat. Bowel sounds are normal.     Palpations: Abdomen is soft.     Tenderness: There is no abdominal tenderness.      CBC Latest Ref Rng & Units 03/06/2020  WBC 4.0 - 10.5 K/uL 9.4  Hemoglobin 12.0 - 15.0 g/dL 11.5(L)  Hematocrit 36 - 46 % 35.2(L)  Platelets 150 - 400 K/uL 306   CMP Latest Ref Rng & Units 03/06/2020  Glucose 70 - 99 mg/dL 03/08/2020)  BUN 8 - 23 mg/dL 18  Creatinine 606(T - 0.16 mg/dL 0.10  Sodium 9.32 - 355 mmol/L 141  Potassium 3.5 - 5.1 mmol/L 3.2(L)  Chloride 98 - 111 mmol/L 101  CO2 22 - 32 mmol/L 30  Calcium 8.9 - 10.3 mg/dL 732)    MRA Head:  IMPRESSION: Left PCA occlusion. Diffuse intracranial atherosclerotic disease of the medium to small vessels  as outlined above.  Assessment/Plan:  Active Problems:   CVA (cerebral vascular accident) [facility])  Ms. Feliz is a 86 yo F w/ a PMHx of a CVA in 01/2020, HTN, DM2, hypothyroidism, CKD II, and anemia of chronic renal failure with MRI showing extension of prior PCA stroke.  Plan by Problem: PCA Stroke Patient with  recent PCA stroke, and unclear adherence to ASA, presented to the ED with a week of slurred and slowed speech and worsening R sided weakness and numbness. MRI demonstrated extension of prior L PCA stroke. Patient placed on dual antiplatelet therapy with ASA and Plavix. Her Lipitor was increased to 80 mg QD. Currently awaiting carotid 02/2020.   - Appreciated neurology recommendations - Continue Lipitor 80 mg QD - Continue ASA and Plavix - Mg: 1.7 - U/A Pending - Carotid US FU - FU UA  Midshaft Humeral Fracture:  - Tylenol PRN - can restart home percocet if pain resistant to tylenol  Hypokalemia: -K 3.3  -20 mEQV KDUR BID for 2 doses  HTN: -Holding home meds -BP stable here  DM2: A1c of 7.3 in May. Home medications of glipizide and lantus at home.  - SSI  - Lantus  10U   Hypothyroidism: - Continue home synthroid - TSH: 2.108  CKD II: -Creatinine stable at 0.8 -avoid nephrotoxic meds -trend BMP  Anemia of CKD: -Hgb: 10.8 -Trend CBC  Maudie Mercury, MD 03/07/2020, 6:57 AM Pager: [phone] After 5pm on weekdays and 1pm on weekends: On Call pager [phone]

## 2020-03-07 NOTE — Plan of Care (Signed)
  Problem: Education: Goal: Knowledge of General Education information will improve Description: Including pain rating scale, medication(s)/side effects and non-pharmacologic comfort measures Outcome: Progressing   Problem: Nutrition: Goal: Adequate nutrition will be maintained Outcome: Progressing   Problem: Safety: Goal: Ability to remain free from injury will improve Outcome: Progressing   

## 2020-03-07 NOTE — Progress Notes (Signed)
Inpatient Diabetes Program Recommendations  AACE/ADA: New Consensus Statement on Inpatient Glycemic Control (2015)  Target Ranges:  Prepandial:   less than 140 mg/dL      Peak postprandial:   less than 180 mg/dL (1-2 hours)      Critically ill patients:  140 - 180 mg/dL   Lab Results  Component Value Date   GLUCAP 188 (H) 03/07/2020    Review of Glycemic Control Results for RUMOR, SUN (MRN [id number]) as of 03/07/2020 13:16  Ref. Range 03/06/2020 21:49 03/06/2020 23:02 03/07/2020 04:21 03/07/2020 06:27 03/07/2020 11:36  Glucose-Capillary Latest Ref Range: 70 - 99 mg/dL 60 (L) 779 (H) 73 390 (H) 188 (H)   Diabetes history:  DM2  Outpatient Diabetes medications:  Lantus 20 units qam Glipizide 2.5 mg bid  Current orders for Inpatient glycemic control:  Lantus 10 units qhs  Novolog 0-20 units tid Novolog 3 units meal coverage tid  Inpatient Diabetes Program Recommendations:     Was low at 2149 last evening.  The other cbg's within goal. Novolog 0-9 units tid Hold 3 units novolog meal coverage (watch trends)  Will continue to follow while inpatient.  Thank you, Dulce Sellar, RN, BSN Diabetes Coordinator Inpatient Diabetes Program [phone] (team pager from 8a-5p)

## 2020-03-07 NOTE — Evaluation (Addendum)
Clinical/Bedside Swallow Evaluation Patient Details  Name: Yvonne Henderson MRN: [id number] Date of Birth: 1926-11-14  Today's Date: 03/07/2020 Time: SLP Start Time (ACUTE ONLY): 0840 SLP Stop Time (ACUTE ONLY): 0858 SLP Time Calculation (min) (ACUTE ONLY): 18 min  Past Medical History:  Past Medical History:  Diagnosis Date  . Anemia associated with chronic renal failure    Provided by family  . Benign essential HTN   . CKD (chronic kidney disease) stage 2, GFR 60-89 ml/min   . Diabetes mellitus without complication (HCC)   . Glaucoma   . Hypertension   . Osteopenia   . Stroke [facility])    Past Surgical History:  Past Surgical History:  Procedure Laterality Date  . ABDOMINAL HYSTERECTOMY     HPI:  Yvonne Henderson is a 86 yo F w/ a PMHx significant for a CVA in 01/2020, HTN, DM2, hypothyroidism, CKD II and anemia of chronic renal failure with MRI showing extension of prior stroke. Baseline cognitive impairment and asymmetry from prior stroke.   Assessment / Plan / Recommendation Clinical Impression  Yvonne Henderson demonstrates an ongoing mild-moderate oral dysphagia. She verbalized occasional difficulty with solids, stating "if I don't get them chewed up good then I'll get strangled." Oral mech exam revealed L sided facial droop with reduced labial seal and lingual strength. These deficits are consistent with CVA last month. She reports feeling her speech got slurred "for a little bit, but is back to normal now." She was seen with a soft breakfast bar (nutri-grain), requiring full assist for feeding, as R arm is in a sling and L arm is very weak. SLP gave small bites initially, then graduated to larger bites. Given large bite, pt had prolonged mastication and strong cough response. She then said, "I don't do it that big." Pt with good insight into her needs. Given small-normal sized bites, pt had no difficulty with this relatively soft solid. She deferred graham cracker, stating "that'll get me  choked." Given several sips of thin liquid water via cup and straw, pt had no difficulty, no s/s aspiration.   Recommend: finely chopped solids, thin liquids, alternate liquids/solids, FULL ASSIST for feeding, ensure oral cavity is clear at end of meal, meds whole in puree or crushed if large     SLP Visit Diagnosis: Dysphagia, unspecified (R13.10)    Aspiration Risk  Mild aspiration risk    Diet Recommendation Dysphagia 3 (Mech soft)   Liquid Administration via: Straw Medication Administration: Other (Comment) (whole with puree unless very large, then crush) Supervision: Staff to assist with self feeding Compensations: Slow rate;Small sips/bites;Follow solids with liquid Postural Changes: Seated upright at 90 degrees;Remain upright for at least 30 minutes after po intake    Other  Recommendations Oral Care Recommendations: Oral care BID   Follow up Recommendations None      Frequency and Duration min 1 x/week  1 week       Prognosis Prognosis for Safe Diet Advancement: Good Barriers to Reach Goals: Cognitive deficits      Swallow Study   General Date of Onset: 03/06/20 HPI: Yvonne Henderson is a 86 yo F w/ a PMHx significant for a CVA in 01/2020, HTN, DM2, hypothyroidism, CKD II and anemia of chronic renal failure with MRI showing extension of prior stroke. Baseline cognitive impairment and asymmetry from prior stroke. Type of Study: Bedside Swallow Evaluation Previous Swallow Assessment: n/a Diet Prior to this Study: NPO Temperature Spikes Noted: No Respiratory Status: Room air History of Recent Intubation: No Behavior/Cognition:  Alert;Cooperative;Confused;Pleasant mood Oral Cavity Assessment: Within Functional Limits Oral Care Completed by SLP: Yes Oral Cavity - Dentition: Adequate natural dentition Vision: Functional for self-feeding Self-Feeding Abilities: Needs assist Patient Positioning: Upright in bed Baseline Vocal Quality: Normal Volitional Cough:  Weak Volitional Swallow: Able to elicit    Oral/Motor/Sensory Function Overall Oral Motor/Sensory Function: Mild impairment Facial ROM: Reduced left Facial Symmetry: Abnormal symmetry left Facial Strength: Reduced left Facial Sensation: Within Functional Limits Lingual ROM: Within Functional Limits Lingual Symmetry: Abnormal symmetry left Lingual Strength: Reduced Lingual Sensation: Within Functional Limits Velum: Within Functional Limits Mandible: Within Functional Limits   Ice Chips     Thin Liquid Presentation: Spoon;Cup;Straw    Solid     Solid: Within functional limits Other Comments: strong cough on large bite--pt reports she normally takes small bites     Hakop Humbarger P. Chasity Outten, M.S., CCC-SLP Speech-Language Pathologist Acute Rehabilitation Services Pager: [place] 03/07/2020,9:01 AM

## 2020-03-07 NOTE — Progress Notes (Signed)
STROKE TEAM PROGRESS NOTE   INTERVAL HISTORY No family at bedside. Pt lying in bed, pleasant but confused. She had right arm in splint due to fracture. She was able to follow simple commands but not orientated. Still has right leg weakness, but not able to test left arm due to fracture. She has perseveration and right facial droop with hemianopia. Not good candidate for loop recorder, will do 30 day monitoring.   Vitals:   03/06/20 2301 03/07/20 0022 03/07/20 0316 03/07/20 0822  BP: (!) 161/81 (!) 141/83 134/78 (!) 139/92  Pulse: 82 77 81 75  Resp: 16 16 19 18   Temp: 98.1 F (36.7 C) 97.9 F (36.6 C) 98.2 F (36.8 C) 99 F (37.2 C)  TempSrc: Oral Oral Oral Oral  SpO2: 92% 100% 100% 100%    CBC:  Recent Labs  Lab 03/06/20 1306 03/07/20 0637  WBC 9.4 7.8  NEUTROABS 6.4 5.1  HGB 11.5* 10.8*  HCT 35.2* 32.8*  MCV 86.7 85.4  PLT 306 302    Basic Metabolic Panel:  Recent Labs  Lab 03/06/20 1306 03/07/20 0637  NA 141 139  K 3.2* 3.3*  CL 101 103  CO2 30 28  GLUCOSE 105* 127*  BUN 18 18  CREATININE 0.96 0.80  CALCIUM 8.7* 8.8*  MG  --  1.7    IMAGING past 24 hours MR ANGIO HEAD WO CONTRAST  Result Date: 03/06/2020 CLINICAL DATA:  Slurred speech. Extension of left PCA territory stroke. EXAM: MRA HEAD WITHOUT CONTRAST TECHNIQUE: Angiographic images of the Circle of Willis were obtained using MRA technique without intravenous contrast. COMPARISON:  MRI same day FINDINGS: Both internal carotid arteries are patent through the skull base and siphon regions. On the right, the anterior and middle cerebral vessels show flow but appear to show diffuse atherosclerotic irregularity. 50% stenosis in the M1 M2 junction region. On the left, the anterior and middle cerebral vessels are patent. There is some distal vessel atherosclerotic irregularity, though not as severe as on the right. Both vertebral arteries are patent to the basilar with the right being dominant. Dominant right  posteroinferior cerebellar artery. Both vertebral arteries reach the basilar. No basilar stenosis. Dominant left anterior inferior cerebellar artery. Both superior cerebellar arteries show flow. Right PCA is patent with pronounced atherosclerotic irregularity. Left PCA is occluded less than 1 cm beyond the origin. IMPRESSION: Left PCA occlusion. Diffuse intracranial atherosclerotic disease of the medium to small vessels as outlined above. Electronically Signed   By: 03/08/2020 M.D.   On: 03/06/2020 17:20   MR BRAIN WO CONTRAST  Result Date: 03/06/2020 CLINICAL DATA:  Slurred speech beginning 5 days ago. EXAM: MRI HEAD WITHOUT CONTRAST TECHNIQUE: Multiplanar, multiecho pulse sequences of the brain and surrounding structures were obtained without intravenous contrast. COMPARISON:  02/01/2020 FINDINGS: Brain: 1 month ago, the patient presented with acute infarction in the left PCA territory. Today's study shows some residual signal abnormality related to that acute infarction in May, but also shows presence of some extension of infarction in the vascular territory affecting areas of the brain not involved previously, most notably areas in the cerebral peduncle and thalamus and the splenium of the corpus callosum. No evidence of hemorrhage or mass effect. Chronic small-vessel ischemic changes again seen affecting the pons and cerebellum. Small-vessel changes throughout the thalami and hemispheric deep white matter. No mass, hemorrhage, hydrocephalus or extra-axial collection. Vascular: Major vessels at the base of the brain show flow. Skull and upper cervical spine: Negative Sinuses/Orbits: Clear/normal  Other: None IMPRESSION: Background pattern of extensive chronic small-vessel ischemic changes throughout the brain. Changes of subacute infarction in the left PCA territory that were acute on 02/01/2020. Acute extension of infarction in the left PCA territory, affecting areas of the brain not involved previously,  including the left cerebral peduncle, more involvement in the lateral thalamus, and more involvement in the splenium of the corpus callosum. Electronically Signed   By: Nelson Chimes M.D.   On: 03/06/2020 14:22    PHYSICAL EXAM  Temp:  [97.9 F (36.6 C)-99 F (37.2 C)] 99 F (37.2 C) (06/16 0822) Pulse Rate:  [75-82] 75 (06/16 0822) Resp:  [16-20] 18 (06/16 0822) BP: (134-164)/(71-92) 139/92 (06/16 0822) SpO2:  [92 %-100 %] 100 % (06/16 0822)  General - Well nourished, well developed, in no apparent distress.  Ophthalmologic - fundi not visualized due to noncooperation.  Cardiovascular - Regular rhythm and rate, but intermittent PVCs on tele.  Neuro - awake alert, able to tell me her name but not orientated to place, time or age. Able to have spontaneous speech but perseverated on answering questions. Able to name 1/3 and then perseverated, able to repeat simple sentences. Able to follow simple commands. Right hemianopia, not blinking to visual threat on the right. PERRL. Able to gaze bilaterally. Right mild facial droop. Tongue midline. LUE and LLE normal strength. RUE in splint due to fracture, not able to test strength. But RLE 2/5 with pain. Sensation subjectively symmetrical, Left FTN intact. Gait not tested.    ASSESSMENT/PLAN Ms. Saralyn Willison is a 86 y.o. female with history of stroke 01/2020, hypertension, glaucoma, diabetes, chronic kidney disease stage II d/c to SNF 6/10 from [facility] after suffering a broken R arm with noted worsening stroke sx (R facial droop, slurred speech R sided weakness and increased confusion), presenting to [employer] 5/15 with continued worsening stroke symptoms.   Stroke:   Extension of L PCA infarct secondary to L PCA occlusion, infarct likely embolic d/t unknown source  MRI  Acute extension of subacute L PCA infarct - L cerebral peduncle, lateral thalamus, splenium of corpus callosum. Diffuse small vessel disease  throughout.   MRA  L PCA occlusion w/ diffuse small and medium vessel disease.   Carotid Doppler unremarkable  LE doppler no DVT  Patient not good candidate for loop recorder, will recommend 30-day cardiac event monitor as outpatient to rule out A. fib as outpatient  LDL 175 01/2020  HgbA1c 7.3 01/2020  Lovenox 30 mg sq daily for VTE prophylaxis  aspirin 325 mg daily prior to admission, now on aspirin 325 mg daily and clopidogrel 75 mg daily. Continue DAPT x 3 months then aspirin alone given left PCA occlusion  Therapy recommendations:  pending - anticipate return to SNF   Disposition:  pending (admitted from SNF)  Hx stroke/TIA  01/2020 L PCA infarct.  EF 55 to 60%.  A1c 7.3, LDL 175.  Asa 325 and lipitor 40. D/c to [facility] rehab)  Hypertension  Stable . Permissive hypertension (OK if < 220/120) but gradually normalize in 2-3 days . Avoid low BP . Long-term BP goal 130-150  Hyperlipidemia  Home meds:  lipitor 40   now on lipitor 80  LDL 175 01/2020, goal < 70  Continue statin at discharge  Diabetes type II, uncontrolled   HgbA1c 7.3 01/2020, goal < 7.0  DB RN coordinator following  CBGs  SSI  Close PCP follow-up for better DM control  Other  Stroke Risk Factors  Advanced age  Other Active Problems  R mdishaft humeral fx  Hypokalemia, K 3.2  Hypothyroidism   CKD stage II  Anemia of CKD  Hospital day # 1  Neurology will sign off. Please call with questions. Pt will follow up with stroke clinic NP at [facility] in about 4 weeks. Thanks for the consult.   Marvel Plan, MD PhD Stroke Neurology 03/07/2020 9:54 PM    To contact Stroke Continuity provider, please refer to [email or web address]. After hours, contact General Neurology

## 2020-03-07 NOTE — NC FL2 (Signed)
[place] LEVEL OF CARE SCREENING TOOL     IDENTIFICATION  Patient Name: Yvonne Henderson Birthdate: Mar 09, 1927 Sex: female Admission Date (Current Location): 03/06/2020  [facility] and [place] Number:  Herbalist and Address:  [facility] [place], [place] [address], [place], [place] [address]      Provider Number: [id number]  Attending Physician Name and Address:  Aldine Contes, MD  Relative Name and Phone Number:       Current Level of Care: Hospital Recommended Level of Care: [place] Prior Approval Number:    Date Approved/Denied:   PASRR Number: [id number]  Discharge Plan: SNF    Current Diagnoses: Patient Active Problem List   Diagnosis Date Noted  . CVA (cerebral vascular accident) ([place]) 03/06/2020  . Anemia associated with chronic renal failure   . Glaucoma   . Osteopenia   . Benign essential HTN     Orientation RESPIRATION BLADDER Height & Weight     Self  Normal Incontinent Weight:   Height:     BEHAVIORAL SYMPTOMS/MOOD NEUROLOGICAL BOWEL NUTRITION STATUS      Continent Diet (dysphagia 2 with thins)  AMBULATORY STATUS COMMUNICATION OF NEEDS Skin   Extensive Assist Verbally Normal                       Personal Care Assistance Level of Assistance  Bathing, Feeding, Dressing Bathing Assistance: Maximum assistance Feeding assistance: Maximum assistance Dressing Assistance: Maximum assistance     Functional Limitations Info    Sight Info: Impaired Hearing Info: Impaired Speech Info: Impaired (slur/ dysarthric)    SPECIAL CARE FACTORS FREQUENCY  Speech therapy     PT Frequency: 5x/wk OT Frequency: 5x/wk     Speech Therapy Frequency: 5x/wk      Contractures Contractures Info: Not present    Additional Factors Info  Code Status, Allergies, Insulin Sliding Scale Code Status Info: DNR Allergies Info: NKA   Insulin Sliding Scale Info: Novolog 0-6 units SQ three times a day        Current Medications (03/07/2020):  This is the current hospital active medication list Current Facility-Administered Medications  Medication Dose Route Frequency Provider Last Rate Last Admin  . acetaminophen (TYLENOL) tablet 650 mg  650 mg Oral Q6H PRN Al Decant, MD      . aspirin EC tablet 325 mg  325 mg Oral Daily Seawell, Jaimie A, DO   325 mg at 03/07/20 1044  . atorvastatin (LIPITOR) tablet 80 mg  80 mg Oral Daily Seawell, Jaimie A, DO   80 mg at 03/07/20 1044  . clopidogrel (PLAVIX) tablet 75 mg  75 mg Oral Daily Seawell, Jaimie A, DO   75 mg at 03/07/20 1044  . enoxaparin (LOVENOX) injection 40 mg  40 mg Subcutaneous Q24H Pierce, Dwayne A, RPH      . insulin aspart (novoLOG) injection 0-15 Units  0-15 Units Subcutaneous TID WC Neva Seat, MD      . insulin glargine (LANTUS) injection 10 Units  10 Units Subcutaneous QHS Seawell, Jaimie A, DO      . latanoprost (XALATAN) 0.005 % ophthalmic solution 1 drop  1 drop Both Eyes Daily Seawell, Jaimie A, DO   1 drop at 03/07/20 1047  . levothyroxine (SYNTHROID) tablet 88 mcg  88 mcg Oral Q0600 Seawell, Jaimie A, DO   88 mcg at 03/07/20 0531  . potassium chloride SA (KLOR-CON) CR tablet 20 mEq  20 mEq Oral BID Maudie Mercury,  MD   20 mEq at 03/07/20 1315  . senna-docusate (Senokot-S) tablet 1 tablet  1 tablet Oral QHS PRN Seawell, Jaimie A, DO      . timolol (TIMOPTIC) 0.5 % ophthalmic solution 1 drop  1 drop Both Eyes Daily Seawell, Jaimie A, DO   1 drop at 03/07/20 1050     Discharge Medications: Please see discharge summary for a list of discharge medications.  Relevant Imaging Results:  Relevant Lab Results:   Additional Information SS # [id number]  Kermit Balo, RN

## 2020-03-08 ENCOUNTER — Other Ambulatory Visit: Payer: Self-pay | Admitting: Physician Assistant

## 2020-03-08 DIAGNOSIS — I63432 Cerebral infarction due to embolism of left posterior cerebral artery: Secondary | ICD-10-CM

## 2020-03-08 LAB — CBC
HCT: 33.2 % — ABNORMAL LOW (ref 36.0–46.0)
Hemoglobin: 10.8 g/dL — ABNORMAL LOW (ref 12.0–15.0)
MCH: 27.8 pg (ref 26.0–34.0)
MCHC: 32.5 g/dL (ref 30.0–36.0)
MCV: 85.3 fL (ref 80.0–100.0)
Platelets: 320 10*3/uL (ref 150–400)
RBC: 3.89 MIL/uL (ref 3.87–5.11)
RDW: 13.5 % (ref 11.5–15.5)
WBC: 7.7 10*3/uL (ref 4.0–10.5)
nRBC: 0 % (ref 0.0–0.2)

## 2020-03-08 LAB — BASIC METABOLIC PANEL
Anion gap: 10 (ref 5–15)
BUN: 17 mg/dL (ref 8–23)
CO2: 27 mmol/L (ref 22–32)
Calcium: 8.8 mg/dL — ABNORMAL LOW (ref 8.9–10.3)
Chloride: 103 mmol/L (ref 98–111)
Creatinine, Ser: 0.9 mg/dL (ref 0.44–1.00)
GFR calc Af Amer: >60 mL/min (ref >60)
GFR calc non Af Amer: 56 mL/min — ABNORMAL LOW (ref >60)
Glucose, Bld: 128 mg/dL — ABNORMAL HIGH (ref 70–99)
Potassium: 3.5 mmol/L (ref 3.5–5.1)
Sodium: 140 mmol/L (ref 135–145)

## 2020-03-08 LAB — GLUCOSE, CAPILLARY
Glucose-Capillary: 119 mg/dL — ABNORMAL HIGH (ref 70–99)
Glucose-Capillary: 170 mg/dL — ABNORMAL HIGH (ref 70–99)
Glucose-Capillary: 181 mg/dL — ABNORMAL HIGH (ref 70–99)
Glucose-Capillary: 228 mg/dL — ABNORMAL HIGH (ref 70–99)

## 2020-03-08 NOTE — Progress Notes (Addendum)
Subjective: O/N Events: None  Yvonne Henderson was seen at bedside this morning. She states that she is doing well and "alive" today. She has no complaints at this time. She states that she feels better than yesterday. She endorses having an appetite. Today she is able to state her name, location, and year. All questions and concerns were addressed.     Objective:  Vital signs in last 24 hours: Vitals:   03/07/20 1929 03/07/20 1937 03/07/20 2325 03/08/20 0327  BP:  133/75 134/65 (!) 150/74  Pulse:  78 67 75  Resp:  18 19 18   Temp: 98.3 F (36.8 C) 98 F (36.7 C) 98.6 F (37 C) 98.4 F (36.9 C)  TempSrc: Oral Oral Axillary Axillary  SpO2:  100% 100% 99%    Physical Exam Vitals reviewed.  Constitutional:      Appearance: Normal appearance.  HENT:     Head: Normocephalic and atraumatic.  Cardiovascular:     Rate and Rhythm: Normal rate and regular rhythm.     Pulses: Normal pulses.     Heart sounds: Normal heart sounds. No murmur heard.  No gallop.   Pulmonary:     Effort: Pulmonary effort is normal.     Breath sounds: Normal breath sounds. No wheezing or rales.  Abdominal:     General: Abdomen is flat. Bowel sounds are normal.  Musculoskeletal:     Comments: R arm in sling, unable to lift RUE or RLE 0/5 LUE and LLE 4/5 strength.   Neurological:     Mental Status: She is alert and oriented to person, place, and time.    Labs:  CBC Latest Ref Rng & Units 03/07/2020 03/06/2020  WBC 4.0 - 10.5 K/uL 7.8 9.4  Hemoglobin 12.0 - 15.0 g/dL 10.8(L) 11.5(L)  Hematocrit 36 - 46 % 32.8(L) 35.2(L)  Platelets 150 - 400 K/uL 302 306   CMP Latest Ref Rng & Units 03/07/2020 03/06/2020  Glucose 70 - 99 mg/dL 03/08/2020) 161(W)  BUN 8 - 23 mg/dL 18 18  Creatinine 960(A - 1.00 mg/dL 5.40 9.81  Sodium 1.91 - 145 mmol/L 139 141  Potassium 3.5 - 5.1 mmol/L 3.3(L) 3.2(L)  Chloride 98 - 111 mmol/L 103 101  CO2 22 - 32 mmol/L 28 30  Calcium 8.9 - 10.3 mg/dL 478) 2.9(F)  Total Protein 6.5 - 8.1  g/dL 6.0(L) -  Total Bilirubin 0.3 - 1.2 mg/dL 1.0 -  Alkaline Phos 38 - 126 U/L 43 -  AST 15 - 41 U/L 21 -  ALT 0 - 44 U/L 14 -   IMAGING:  MRA Head:  IMPRESSION: Left PCA occlusion. Diffuse intracranial atherosclerotic disease of the medium to small vessels as outlined above.  Summary: CAROTIDS 6.2(Z Right Carotid: There is no evidence of stenosis in the right ICA.  Left Carotid: Velocities in the left ICA are consistent with a 1-39%  stenosis.  Vertebrals: Bilateral vertebral arteries demonstrate antegrade flow.  Subclavians: Normal flow hemodynamics were seen in bilateral subclavian       arteries.   Summary: VAS [place] LOWER EXTREMITY BILATERAL:  - No evidence of deep vein thrombosis seen in the lower extremities,  bilaterally.  -No evidence of popliteal cyst, bilaterally.   Assessment/Plan:  Active Problems:   CVA (cerebral vascular accident) [facility])  Yvonne Henderson is a 86 yo F w/ a PMHx of a CVA in 01/2020, HTN, DM2, hypothyroidism, CKD II, and anemia of chronic renal failure with MRI showing extension of prior PCA stroke.  Plan  by Problem: PCA Stroke Patient with  recent PCA stroke, and unclear adherence to ASA, presented to the ED with a week of slurred and slowed speech and worsening R sided weakness and numbness. MRI demonstrated extension of prior L PCA stroke. Patient placed on dual antiplatelet therapy with ASA and Plavix. Her Lipitor was increased to 80 mg QD. Carotid US and Duplex came back with L carotid 1-39% stenosis. No DVTs detected.  - Appreciated neurology recommendations - Continue Lipitor 80 mg QD - Continue ASA and Plavix - Neurology recommending cardiac event monitoring in the outpatient setting.   Midshaft Humeral Fracture:  - Tylenol PRN - can restart home percocet if pain resistant to tylenol  Hypokalemia: Resolved -K 3.5   HTN: -Holding home meds -BP stable here  DM2: A1c of 7.3 in May. Home medications of glipizide and lantus at  home.  - SSI  - Lantus 10U   Hypothyroidism: - Continue home synthroid  CKD II: -Creatinine stable at 0.90 -avoid nephrotoxic meds -trend BMP  Anemia of CKD: -Hgb: 10.8 -Trend CBC  Yvonne Mercury, MD 03/08/2020, 5:45 AM Pager: [phone] After 5pm on weekdays and 1pm on weekends: On Call pager [phone]

## 2020-03-08 NOTE — Progress Notes (Signed)
Internal Medicine Attending:   I saw and examined the patient. I reviewed the resident's note and I agree with the resident's findings and plan as documented in the resident's note.  Patient states that she feels well today and denies any new complaints.  Patient was initially admitted to hospital with progression of her left PCA CVA.  Patient with a mild improvement in strength in her right lower extremity (now 2+).  Unable to assess strength in her right upper extremity as it is in a sling secondary to humeral fracture.  We will continue with Lipitor 80 mg daily as well as aspirin and Plavix.  Carotid ultrasound showed mild left carotid stenosis of 1 to 39%.  Lower extremity Dopplers with no evidence of DVT.  Neuro follow-up and recommendations appreciated.  Will place a 30-day cardiac event monitor as an outpatient to rule out A. fib.  Continue dual antiplatelet therapy for 3 months and then aspirin alone.  PT/OT recommending SNF placement.  No further work-up at this time.  Patient stable for DC to SNF once bed is available.

## 2020-03-08 NOTE — Discharge Summary (Signed)
Name: Yvonne Henderson MRN: [id number] DOB: 1927-06-16 86 y.o. PCP: Yvonne Slick, MD  Date of Admission: 03/06/2020 12:27 PM Date of Discharge: 03/09/2020 Attending Physician: Earl Lagos, MD  Discharge Diagnosis: 1. Extension of PCA Stroke 2. TIIDM 3. Hypertension  Discharge Medications: Allergies as of 03/09/2020   No Known Allergies     Medication List    STOP taking these medications   hydrochlorothiazide 25 MG tablet Commonly known as: HYDRODIURIL   Lasix 20 MG tablet Generic drug: furosemide   oxyCODONE-acetaminophen 5-325 MG tablet Commonly known as: PERCOCET/ROXICET     TAKE these medications   acetaminophen 325 MG tablet Commonly known as: TYLENOL Take 2 tablets (650 mg total) by mouth every 6 (six) hours as needed for mild pain or moderate pain.   amLODipine 10 MG tablet Commonly known as: NORVASC Take 10 mg by mouth daily.   aspirin 325 MG EC tablet Take 1 tablet (325 mg total) by mouth daily. Start taking on: March 10, 2020   atorvastatin 40 MG tablet Commonly known as: LIPITOR Take 40 mg by mouth at bedtime.   clopidogrel 75 MG tablet Commonly known as: PLAVIX Take 1 tablet (75 mg total) by mouth daily. Start taking on: March 10, 2020   Ferrex 150 150 MG capsule Generic drug: iron polysaccharides Take 150 mg by mouth daily.   glipiZIDE 5 MG tablet Commonly known as: GLUCOTROL Take 2.5 mg by mouth 2 (two) times daily.   Lantus SoloStar 100 UNIT/ML Solostar Pen Generic drug: insulin glargine Inject 20 Units into the skin in the morning.   latanoprost 0.005 % ophthalmic solution Commonly known as: XALATAN Place 1 drop into both eyes daily.   levothyroxine 88 MCG tablet Commonly known as: SYNTHROID Take 88 mcg by mouth daily.   potassium chloride SA 20 MEQ tablet Commonly known as: KLOR-CON Take 40 mEq by mouth once.   timolol 0.5 % ophthalmic solution Commonly known as: TIMOPTIC Place 1 drop into both eyes daily.     Vitamin D (Ergocalciferol) 1.25 MG (50000 UNIT) Caps capsule Commonly known as: DRISDOL Take 50,000 Units by mouth 2 (two) times a week.       Disposition and follow-up:   Yvonne Henderson was discharged from [facility] in Stable condition.  At the hospital follow up visit please address:  1. Extension of PCA Stroke: Has left PCA occlusion, possibly 2/2 to not getting aspirin. Important patient receives daily aspirin 325 mg and plavix 75 mg. Lipitor 80 mg qd. Continue plavix for three months, then give aspirin alone. Needs dysphagia III diet. New deficits include RUE and RLE weakness and dysarthria. Prev deficit includes memory impairment from prior stroke.  2. TIIDM: continue home medications Right Humerus Fracture: She did not require her oxycodone during hospitalization. Continue this prn but would assess whether it is necessary considering recent falls.   3. Hypertension: Patient's blood pressure medications were held during admission due to extension of her stroke. During her admission, her systolic pressures remained in the 110s-150s. Her Norvasc was restarted, but consider adding back medications if pressures begin to elevate.   2.  Labs / imaging needed at time of follow-up: none  3.  Pending labs/ test needing follow-up: none  Follow-up Appointments:  Follow-up Information    [facility]. Schedule an appointment as soon as possible for a visit in 4 week(s).   Specialty: Neurology Contact information: [address] [place] [address] [phone]  [facility]. Schedule an appointment as soon as possible for a visit in 1 week(s).   Why: Please call in 1 week to schedule an appointment Contact information: [place] [phone] [place] [facility] Course by problem list: 1. Extension of PCA  Stroke  [name] is a 86yo female with PMH PCA CVA, osteopenia, HTN, glaucoma, TIIDM, CKD Stage II who presented with new onset RLE and RUE weakness, and dysarthria. She was recently admitted for PCA stroke and discharge home on aspirin and home health. Patient subsequently fell and suffered a right humeral fracture for which she was transferred to SNF. It is unclear if she was receiving her aspirin 325 mg s/p stroke.  MRI was done which showed extension of her PCA stroke. Neurology was consulted and recommended increasing to plavix 75 mg in addition to her aspirin 325 mg. Lipitor was increased to 80 mg qd. MRA was done which showed complete occlusion of the proximal PCA. Carotid US and DVT LE US showed no acute thrombus. Neurology also recommended 30 day cardiac monitoring. She will continue aspirin and plavix for three months then plavix alone. She will continue increased dose of lipitor 80 mg.    Discharge Vitals:   BP (!) 142/72 (BP Location: Left Arm)   Pulse 85   Temp 97.9 F (36.6 C) (Axillary)   Resp 18   SpO2 100%   Pertinent Labs, Studies, and Procedures:   6/16 LE Vas [place] Summary:  Right Carotid: There is no evidence of stenosis in the right ICA.   Left Carotid: Velocities in the left ICA are consistent with a 1-39%  stenosis.   Vertebrals: Bilateral vertebral arteries demonstrate antegrade flow.  Subclavians: Normal flow hemodynamics were seen in bilateral subclavian        arteries.   *See table(s) above for measurements and observations.   Electronically signed by Harold Barban MD on 03/07/2020 at 11:21:25 PM.  Carotid Doppler US  Summary:  Right Carotid: There is no evidence of stenosis in the right ICA.   Left Carotid: Velocities in the left ICA are consistent with a 1-39%  stenosis.   Vertebrals: Bilateral vertebral arteries demonstrate antegrade flow.  Subclavians: Normal flow hemodynamics were seen in bilateral subclavian         arteries.   *See table(s) above for measurements and observations.      Electronically signed by Harold Barban MD on 03/07/2020 at 11:21:25 PM.  MRA Head IMPRESSION: Left PCA occlusion.  Diffuse intracranial atherosclerotic disease of the medium to small vessels as outlined above.   Electronically Signed   By: Nelson Chimes M.D.   On: 03/06/2020 17:20  MR Brain 6/15 IMPRESSION: Background pattern of extensive chronic small-vessel ischemic changes throughout the brain.  Changes of subacute infarction in the left PCA territory that were acute on 02/01/2020. Acute extension of infarction in the left PCA territory, affecting areas of the brain not involved previously, including the left cerebral peduncle, more involvement in the lateral thalamus, and more involvement in the splenium of the corpus callosum.   Electronically Signed   By: Nelson Chimes M.D.   On: 03/06/2020 14:22  Discharge Instructions: Discharge Instructions    Ambulatory referral to Neurology   Complete by: As directed    Follow up with stroke clinic NP (Jessica Vanschaick or Cecille Rubin, if both not available, consider Zachery Dauer, or  Ahern) at [facility] in about 4 weeks. Thanks.   Call MD for:  persistant dizziness or light-headedness   Complete by: As directed    Call MD for:  severe uncontrolled pain   Complete by: As directed    Diet - low sodium heart healthy   Complete by: As directed    Increase activity slowly   Complete by: As directed       Signed: Dolan Amen, MD 03/09/2020, 11:25 AM   Pager: [phone]

## 2020-03-09 LAB — GLUCOSE, CAPILLARY
Glucose-Capillary: 131 mg/dL — ABNORMAL HIGH (ref 70–99)
Glucose-Capillary: 149 mg/dL — ABNORMAL HIGH (ref 70–99)

## 2020-03-09 MED ORDER — ACETAMINOPHEN 325 MG PO TABS: 650.0000 mg | ORAL_TABLET | Freq: Four times a day (QID) | ORAL | 0 refills | Status: DC | PRN

## 2020-03-09 MED ORDER — ASPIRIN 325 MG PO TBEC: 325.0000 mg | DELAYED_RELEASE_TABLET | Freq: Every day | ORAL | 0 refills | Status: AC

## 2020-03-09 MED ORDER — CLOPIDOGREL BISULFATE 75 MG PO TABS: 75.0000 mg | ORAL_TABLET | Freq: Every day | ORAL | 0 refills | Status: AC

## 2020-03-09 NOTE — Progress Notes (Addendum)
Subjective: O/N Events: None  Yvonne Henderson was evaluated at bedside this AM. She states that she is doing well this morning. She is alert to self and time, but not place. We spoke with her about SNF placement today. Patient is agreeable. All questions and concerns were addressed.    Objective:  Vital signs in last 24 hours: Vitals:   03/08/20 1612 03/08/20 1935 03/09/20 0012 03/09/20 0338  BP: (!) 152/73 135/76 (!) 147/68 136/89  Pulse: 81 90 91 88  Resp: 16 18 20 19   Temp: 98.3 F (36.8 C) 98.4 F (36.9 C) 98.5 F (36.9 C) 97.9 F (36.6 C)  TempSrc: Oral Axillary Axillary Axillary  SpO2: 98% 100% 100% 100%   Physical Exam Vitals and nursing note reviewed.  Constitutional:      General: She is not in acute distress.    Comments: Laying comfortably in bed. Appears stated age.   Cardiovascular:     Rate and Rhythm: Normal rate.     Pulses: Normal pulses.     Heart sounds: Normal heart sounds. No murmur heard.  No friction rub. No gallop.   Pulmonary:     Effort: Pulmonary effort is normal.     Breath sounds: Normal breath sounds. No wheezing or rales.  Abdominal:     General: Abdomen is flat. Bowel sounds are normal.     Tenderness: There is no abdominal tenderness. There is no guarding.  Musculoskeletal:     Comments: RUE mobilized with a sling. Unable to move.  RLE 2/5 in strength LUE and LLE: 4/5   Neurological:     Mental Status: She is alert.     Comments: Alert to person and time.       Labs:  CBC Latest Ref Rng & Units 03/08/2020 03/07/2020 03/06/2020  WBC 4.0 - 10.5 K/uL 7.7 7.8 9.4  Hemoglobin 12.0 - 15.0 g/dL 10.8(L) 10.8(L) 11.5(L)  Hematocrit 36 - 46 % 33.2(L) 32.8(L) 35.2(L)  Platelets 150 - 400 K/uL 320 302 306   CMP Latest Ref Rng & Units 03/08/2020 03/07/2020 03/06/2020  Glucose 70 - 99 mg/dL 03/08/2020) 578(I) 696(E)  BUN 8 - 23 mg/dL 17 18 18   Creatinine 0.44 - 1.00 mg/dL 952(W 4.13  Sodium 135 - 145 mmol/L 140 139 141  Potassium 3.5 - 5.1  mmol/L 3.5 3.3(L) 3.2(L)  Chloride 98 - 111 mmol/L 103 103 101  CO2 22 - 32 mmol/L 27 28 30   Calcium 8.9 - 10.3 mg/dL 2.44) 0.10) )  Total Protein 6.5 - 8.1 g/dL - 6.0(L) -  Total Bilirubin 0.3 - 1.2 mg/dL - 1.0 -  Alkaline Phos 38 - 126 U/L - 43 -  AST 15 - 41 U/L - 21 -  ALT 0 - 44 U/L - 14 -   IMAGING:  MRA Head:  IMPRESSION: Left PCA occlusion. Diffuse intracranial atherosclerotic disease of the medium to small vessels as outlined above.  Summary: CAROTIDS 2.7(O Right Carotid: There is no evidence of stenosis in the right ICA.  Left Carotid: Velocities in the left ICA are consistent with a 1-39%  stenosis.  Vertebrals: Bilateral vertebral arteries demonstrate antegrade flow.  Subclavians: Normal flow hemodynamics were seen in bilateral subclavian       arteries.   Summary: VAS 5.3(G LOWER EXTREMITY BILATERAL:  - No evidence of deep vein thrombosis seen in the lower extremities,  bilaterally.  -No evidence of popliteal cyst, bilaterally.   Assessment/Plan:  Active Problems:   CVA (cerebral vascular accident) (HCC)  Yvonne Henderson is a 86 yo F w/ a PMHx of a CVA in 01/2020, HTN, DM2, hypothyroidism, CKD II, and anemia of chronic renal failure with MRI showing extension of prior PCA stroke.  Plan by Problem: PCA Stroke Patient with  recent PCA stroke, and unclear adherence to ASA, presented to the ED with a week of slurred and slowed speech and worsening R sided weakness and numbness. MRI demonstrated extension of prior L PCA stroke. Patient placed on dual antiplatelet therapy with ASA and Plavix. Her Lipitor was increased to 80 mg QD. Carotid US and Duplex came back with L carotid 1-39% stenosis. No DVTs detected.  - Appreciated neurology recommendations - Continue Lipitor 80 mg QD - Continue ASA 325 mg QD and Plavix 75 mg QD. Continue DAPT for three months, then continue stand alone ASA.  - Neurology recommending cardiac event monitoring in the outpatient  setting.   Midshaft Humeral Fracture:  - Tylenol PRN - can restart home percocet if pain resistant to tylenol  HTN: -Holding home meds -BP stable here  DM2: A1c of 7.3 in May. Home medications of glipizide and lantus at home.  - SSI  - Lantus 10U   Hypothyroidism: - Continue home synthroid  CKD II: -Creatinine stable at 0.90 -avoid nephrotoxic meds -trend BMP  Anemia of CKD: -Hgb: 10.8  Hypokalemia: Resolved   Maudie Mercury, MD 03/09/2020, 6:42 AM Pager: [phone] After 5pm on weekdays and 1pm on weekends: On Call pager [phone]

## 2020-03-09 NOTE — TOC Transition Note (Signed)
Transition of Care [facility]) - CM/SW Discharge Note   Patient Details  Name: Yvonne Henderson MRN: [id number] Date of Birth: 11-29-1926  Transition of Care [facility]) CM/SW Contact:  Kermit Balo, RN Phone Number: 03/09/2020, 1:07 PM   Clinical Narrative:    Pt is discharging back to [facility] today. CM updated the patients granddaughter over the phone. She initially wanted to make sure [facility] wouldn't have a private bed for her grandmother. CM called [facility] and they will not offer a bed. Granddaughter updated and asked that she return to [facility].  [facility] arranged for transport. Bedside Rn updated and d/c packet at the desk.   Room: 407P Number for report: [phone]    Final next level of care: Skilled Nursing Facility Barriers to Discharge: No Barriers Identified   Patient Goals and CMS Choice        Discharge Placement              Patient chooses bed at: [facility] Patient to be transferred to facility by: [facility] Name of family member notified: Charise--granddaughter Patient and family notified of of transfer: 03/09/20  Discharge Plan and Services                                     Social Determinants of Health (SDOH) Interventions     Readmission Risk Interventions No flowsheet data found.

## 2020-03-09 NOTE — Progress Notes (Signed)
Pt has received insurance auth for readmission to [facility]:  Ref# [id number]  auth ID: [id number]  Good for 5 days 6/17-6/21 Contact is Elmarie Mainland

## 2020-03-09 NOTE — Progress Notes (Signed)
Internal Medicine Attending:   I saw and examined the patient. I reviewed the resident's note and I agree with the resident's findings and plan as documented in the resident's note.  Patient states that she feels well this morning and denies any new complaints.  Patient was initially admitted to the hospital with extension of her left PCA stroke in the setting of questionable compliance with aspirin.  We will continue dual antiplatelet therapy for now and transition her to aspirin alone after 3 months.  Continue with high intensity statin.  PT/OT recommending SNF placement.  No further work-up at this time.  Patient stable for DC to SNF today.

## 2020-03-14 ENCOUNTER — Encounter: Payer: Self-pay | Admitting: *Deleted

## 2020-03-14 ENCOUNTER — Ambulatory Visit (INDEPENDENT_AMBULATORY_CARE_PROVIDER_SITE_OTHER): Payer: Medicare PPO | Admitting: Orthopedic Surgery

## 2020-03-14 ENCOUNTER — Ambulatory Visit (INDEPENDENT_AMBULATORY_CARE_PROVIDER_SITE_OTHER): Payer: Medicare PPO

## 2020-03-14 DIAGNOSIS — S42301A Unspecified fracture of shaft of humerus, right arm, initial encounter for closed fracture: Secondary | ICD-10-CM

## 2020-03-14 NOTE — Progress Notes (Signed)
Patient ID: Yvonne Henderson, female   DOB: June 16, 1927, 86 y.o.   MRN: [id number] Patient enrolled for [employer] to ship a 30 day cardiac event monitor to [facility].  Letter of instructions mailed to [facility], Attn : Shanda Bumps.

## 2020-03-15 ENCOUNTER — Telehealth: Payer: Self-pay | Admitting: Orthopedic Surgery

## 2020-03-15 NOTE — Telephone Encounter (Signed)
Yvonne Henderson, with [facility] called requesting the office notes and any orders from the last office visit faxed to her at [phone].  CB#[phone].  Thank you.

## 2020-03-15 NOTE — Telephone Encounter (Signed)
Can fax once dictated.

## 2020-03-16 ENCOUNTER — Encounter: Payer: Self-pay | Admitting: Orthopedic Surgery

## 2020-03-16 NOTE — Progress Notes (Signed)
Office Visit Note   Patient: Yvonne Henderson           Date of Birth: 12/09/1926           MRN: [id number] Visit Date: 03/14/2020 Requested by: Suzan Slick, MD [address] [place],  [place] [address] PCP: Suzan Slick, MD  Subjective: Chief Complaint  Patient presents with  . Humerus injury    HPI: Yvonne Henderson is a 86 y.o. female who presents to the office complaining of right arm pain.  Patient presents following a fall at home on 02/28/2020 where she sustained a humeral shaft fracture.  She has a history of CVA with her first CVA in May of this year which did not give her any significant weakness but did give her memory loss.  Her second CVA was days after her arm injury which has resulted in lasting right leg and right arm weakness.  She comes in today with her granddaughter.  She previously walked with a cane but now she ambulates with a wheelchair.  She is currently staying at [facility].  Her goal is to eventually return home with 24/7 assistance as she was independent prior to these events..                ROS:  All systems reviewed are negative as they relate to the chief complaint within the history of present illness.  Patient denies fevers or chills.  Assessment & Plan: Visit Diagnoses:  1. Closed fracture of shaft of right humerus, unspecified fracture morphology, initial encounter     Plan: Patient is a 86 year old female who presents for evaluation of right humeral shaft fracture.  She has had multiple CVAs this year.  She was previously living independent.  For CVA was in May which resulted in memory loss.  Second CVA has given her right lower extremity and right upper extremity weakness.  Radiographs of the right humerus were taken today which revealed right midshaft humerus fracture with less than 2 cm of bayoneting and mild to moderate displacement.  He does have significant weakness on exam of the right arm.  Pulses present.  With the fracture pattern and  patient's history plan on nonoperative treatment with Sarmiento brace.  Follow-up in 4 weeks for clinical recheck.  Manipulation of the fracture and brace application performed to optimize position for healing.  Follow-Up Instructions: No follow-ups on file.   Orders:  Orders Placed This Encounter  Procedures  . XR Humerus Right   No orders of the defined types were placed in this encounter.     Procedures: No procedures performed   Clinical Data: No additional findings.  Objective: Vital Signs: There were no vitals taken for this visit.  Physical Exam:  Constitutional: Patient appears well-developed HEENT:  Head: Normocephalic Eyes:EOM are normal Neck: Normal range of motion Cardiovascular: Normal rate Pulmonary/chest: Effort normal Neurologic: Patient is alert Skin: Skin is warm Psychiatric: Patient has normal mood and affect  Ortho Exam:  Right shoulder Exam  Significant weakness with active range of motion of the right shoulder.  Unable to lift the shoulder due to pain and weakness. 1+ radial pulse present Weakness with finger abduction, EPL, wrist extension, bicep flexion. No evidence of open fracture or any open wounds.  Specialty Comments:  No specialty comments available.  Imaging: No results found.   PMFS History: Patient Active Problem List   Diagnosis Date Noted  . CVA (cerebral vascular accident) (HCC) 03/06/2020  . Anemia associated with  chronic renal failure   . Glaucoma   . Osteopenia   . Benign essential HTN    Past Medical History:  Diagnosis Date  . Anemia associated with chronic renal failure    Provided by family  . Benign essential HTN   . CKD (chronic kidney disease) stage 2, GFR 60-89 ml/min   . Diabetes mellitus without complication ([place])   . Glaucoma   . Hypertension   . Osteopenia   . Stroke [facility])     Family History  Problem Relation Age of Onset  . Hypertension Mother   . Hypertension Father     Past Surgical  History:  Procedure Laterality Date  . ABDOMINAL HYSTERECTOMY     Social History   Occupational History  . Not on file  Tobacco Use  . Smoking status: Never Smoker  . Smokeless tobacco: Never Used  Substance and Sexual Activity  . Alcohol use: Not Currently  . Drug use: Not Currently  . Sexual activity: Not on file

## 2020-03-16 NOTE — Telephone Encounter (Signed)
Dictation still pending.  

## 2020-03-19 NOTE — Progress Notes (Signed)
sent 

## 2020-03-19 NOTE — Telephone Encounter (Signed)
faxed

## 2020-03-25 ENCOUNTER — Ambulatory Visit (INDEPENDENT_AMBULATORY_CARE_PROVIDER_SITE_OTHER): Payer: Medicare PPO

## 2020-03-25 DIAGNOSIS — I639 Cerebral infarction, unspecified: Secondary | ICD-10-CM | POA: Diagnosis not present

## 2020-03-25 DIAGNOSIS — I63432 Cerebral infarction due to embolism of left posterior cerebral artery: Secondary | ICD-10-CM

## 2020-03-25 DIAGNOSIS — I4891 Unspecified atrial fibrillation: Secondary | ICD-10-CM | POA: Diagnosis not present

## 2020-03-28 ENCOUNTER — Ambulatory Visit (INDEPENDENT_AMBULATORY_CARE_PROVIDER_SITE_OTHER): Payer: Medicare PPO | Admitting: Cardiovascular Disease

## 2020-03-28 ENCOUNTER — Other Ambulatory Visit: Payer: Self-pay

## 2020-03-28 ENCOUNTER — Encounter: Payer: Self-pay | Admitting: Cardiovascular Disease

## 2020-03-28 VITALS — BP 162/88 | HR 95 | Ht 59.0 in

## 2020-03-28 DIAGNOSIS — E785 Hyperlipidemia, unspecified: Secondary | ICD-10-CM | POA: Insufficient documentation

## 2020-03-28 DIAGNOSIS — I63432 Cerebral infarction due to embolism of left posterior cerebral artery: Secondary | ICD-10-CM

## 2020-03-28 DIAGNOSIS — I10 Essential (primary) hypertension: Secondary | ICD-10-CM

## 2020-03-28 NOTE — Assessment & Plan Note (Addendum)
History of essential potential blood pressure measured today 162/88.  Follow-up blood pressure at the end of the interview was 142/82.  She is on amlodipine.

## 2020-03-28 NOTE — Assessment & Plan Note (Signed)
Recent stroke which occurred in early May, 2 weeks after her Covid vaccine with extension on 03/06/2020.  Her carotid Dopplers were negative.  She is wearing a 30-day event monitor.  I am going to get a 2D echo.  This potentially could have been correlated with her Covid vaccination.  If her 2D echo and 30 event monitor unrevealing I will see her back as needed.

## 2020-03-28 NOTE — Patient Instructions (Signed)
Medication Instructions:  Your physician recommends that you continue on your current medications as directed. Please refer to the Current Medication list given to you today.  Lab Work: NONE  Testing/Procedures: Your physician has requested that you have an echocardiogram. Echocardiography is a painless test that uses sound waves to create images of your heart. It provides your doctor with information about the size and shape of your heart and how well your heart's chambers and valves are working. This procedure takes approximately one hour. There are no restrictions for this procedure. [facility] AT [address]  Follow-Up: AS NEEDED

## 2020-03-28 NOTE — Progress Notes (Signed)
03/28/2020 Yvonne Henderson   1927-02-15  [id number]  Primary Physician Suzan Slick, MD Primary Cardiologist: Runell Gess MD Nicholes Calamity, [place]  HPI:  Yvonne Henderson is a 86 y.o. divorced African-American female mother of 2, grandmother of 7 grandchildren is accompanied by one of her granddaughters Cherise today.  She does have a history of treated hypertension, diabetes and hyperlipidemia.  She is never had a heart attack.  She was living independently up until May other than driving.  She had a stroke 2 weeks after her Covid vaccine with extension of her stroke on 03/06/2020 now leaving her hemiparetic in the outpatient rehab facility.  She denies chest pain or shortness of breath.  She is wearing a 30-day event monitor to establish whether or not she is had an arrhythmogenic cause.   Current Meds  Medication Sig  . acetaminophen (TYLENOL) 325 MG tablet Take 2 tablets (650 mg total) by mouth every 6 (six) hours as needed for mild pain or moderate pain.  Marland Kitchen amLODipine (NORVASC) 10 MG tablet Take 10 mg by mouth daily.  Marland Kitchen aspirin EC 325 MG EC tablet Take 1 tablet (325 mg total) by mouth daily.  Marland Kitchen atorvastatin (LIPITOR) 40 MG tablet Take 40 mg by mouth at bedtime.  . clopidogrel (PLAVIX) 75 MG tablet Take 1 tablet (75 mg total) by mouth daily.  Marland Kitchen FERREX 150 150 MG capsule Take 150 mg by mouth daily.  Marland Kitchen glipiZIDE (GLUCOTROL) 5 MG tablet Take 2.5 mg by mouth 2 (two) times daily.  Marland Kitchen LANTUS SOLOSTAR 100 UNIT/ML Solostar Pen Inject 20 Units into the skin in the morning.  . latanoprost (XALATAN) 0.005 % ophthalmic solution Place 1 drop into both eyes daily.  Marland Kitchen levothyroxine (SYNTHROID) 88 MCG tablet Take 88 mcg by mouth daily.  . potassium chloride SA (KLOR-CON) 20 MEQ tablet Take 40 mEq by mouth once.  . timolol (TIMOPTIC) 0.5 % ophthalmic solution Place 1 drop into both eyes daily.   . Vitamin D, Ergocalciferol, (DRISDOL) 1.25 MG (50000 UNIT) CAPS capsule Take 50,000 Units by  mouth 2 (two) times a week.     No Known Allergies  Social History   Socioeconomic History  . Marital status: Single    Spouse name: Not on file  . Number of children: Not on file  . Years of education: Not on file  . Highest education level: Not on file  Occupational History  . Not on file  Tobacco Use  . Smoking status: Never Smoker  . Smokeless tobacco: Never Used  Substance and Sexual Activity  . Alcohol use: Not Currently  . Drug use: Not Currently  . Sexual activity: Not on file  Other Topics Concern  . Not on file  Social History Narrative  . Not on file   Social Determinants of Health   Financial Resource Strain:   . Difficulty of Paying Living Expenses:   Food Insecurity:   . Worried About Programme researcher, broadcasting/film/video in the Last Year:   . Barista in the Last Year:   Transportation Needs:   . Freight forwarder (Medical):   Marland Kitchen Lack of Transportation (Non-Medical):   Physical Activity:   . Days of Exercise per Week:   . Minutes of Exercise per Session:   Stress:   . Feeling of Stress :   Social Connections:   . Frequency of Communication with Friends and Family:   . Frequency of Social Gatherings with Friends and Family:   .  Attends Religious Services:   . Active Member of Clubs or Organizations:   . Attends Banker Meetings:   Marland Kitchen Marital Status:   Intimate Partner Violence:   . Fear of Current or Ex-Partner:   . Emotionally Abused:   Marland Kitchen Physically Abused:   . Sexually Abused:      Review of Systems: General: negative for chills, fever, night sweats or weight changes.  Cardiovascular: negative for chest pain, dyspnea on exertion, edema, orthopnea, palpitations, paroxysmal nocturnal dyspnea or shortness of breath Dermatological: negative for rash Respiratory: negative for cough or wheezing Urologic: negative for hematuria Abdominal: negative for nausea, vomiting, diarrhea, bright red blood per rectum, melena, or  hematemesis Neurologic: negative for visual changes, syncope, or dizziness All other systems reviewed and are otherwise negative except as noted above.    Blood pressure (!) 162/88, pulse 95, height 4\' 11"  (1.499 m), SpO2 97 %.  General appearance: alert and no distress Neck: no adenopathy, no carotid bruit, no JVD, supple, symmetrical, trachea midline and thyroid not enlarged, symmetric, no tenderness/mass/nodules Lungs: clear to auscultation bilaterally Heart: regular rate and rhythm, S1, S2 normal, no murmur, click, rub or gallop Extremities: extremities normal, atraumatic, no cyanosis or edema Pulses: 2+ and symmetric Skin: Skin color, texture, turgor normal. No rashes or lesions Neurologic: Alert and oriented X 3, normal strength and tone. Normal symmetric reflexes. Normal coordination and gait  EKG not performed today  ASSESSMENT AND PLAN:   Essential hypertension History of essential potential blood pressure measured today 162/88.  She is on amlodipine.  Hyperlipidemia History of hyperlipidemia on statin therapy followed by her PCP  CVA (cerebral vascular accident) (HCC) Recent stroke which occurred in early May, 2 weeks after her Covid vaccine with extension on 03/06/2020.  Her carotid Dopplers were negative.  She is wearing a 30-day event monitor.  I am going to get a 2D echo.  This potentially could have been correlated with her Covid vaccination.  If her 2D echo and 30 event monitor unrevealing I will see her back as needed.      03/08/2020 MD FACP,FACC,FAHA, [facility] 03/28/2020 10:12 AM

## 2020-03-28 NOTE — Assessment & Plan Note (Signed)
History of hyperlipidemia on statin therapy followed by her PCP. 

## 2020-03-29 ENCOUNTER — Telehealth: Payer: Self-pay

## 2020-03-29 NOTE — Telephone Encounter (Signed)
Patient's granddaughter called stating that patients right hand and arm has been swollen since Sunday and is very concerned about the swelling in her right hand.  Stated that patient is still in a splint and wearing her sling.  Would like a call back at [phone]?  Please advise.  Thank you.

## 2020-03-29 NOTE — Telephone Encounter (Signed)
Pls advise.  

## 2020-03-30 ENCOUNTER — Telehealth: Payer: Self-pay

## 2020-03-30 NOTE — Telephone Encounter (Signed)
Called patient's grandaughter to discuss

## 2020-03-30 NOTE — Telephone Encounter (Signed)
Patient's granddaughter called again concerning patient's swelling in her right hand.  Advised her that message had been sent and we are waiting for a response.  Voiced that she understands.

## 2020-04-11 ENCOUNTER — Ambulatory Visit: Payer: Medicare PPO | Admitting: Adult Health

## 2020-04-11 ENCOUNTER — Encounter: Payer: Self-pay | Admitting: Adult Health

## 2020-04-11 VITALS — BP 143/82 | HR 108 | Ht 59.0 in

## 2020-04-11 DIAGNOSIS — I63432 Cerebral infarction due to embolism of left posterior cerebral artery: Secondary | ICD-10-CM | POA: Diagnosis not present

## 2020-04-11 DIAGNOSIS — I10 Essential (primary) hypertension: Secondary | ICD-10-CM

## 2020-04-11 DIAGNOSIS — E782 Mixed hyperlipidemia: Secondary | ICD-10-CM | POA: Diagnosis not present

## 2020-04-11 NOTE — Patient Instructions (Signed)
Continue aspirin 325 mg daily and clopidogrel 75 mg daily  and atorvastatin for secondary stroke prevention  Continue Plavix for additional 2 months and then aspirin alone  Continue to follow up with PCP regarding cholesterol, blood pressure and diabetes management   Order placed for right arm ultrasound due to concern of blood clot.  Please ensure to reach out to orthopedics as they may want to evaluate soon.  Start home health therapies once discharged from SNF including physical, occupational, speech (cognitive), nursing and aides  Continue to monitor blood pressure at home  Maintain strict control of hypertension with blood pressure goal below 130/90, diabetes with hemoglobin A1c goal below 6.5% and cholesterol with LDL cholesterol (bad cholesterol) goal below 70 mg/dL. I also advised the patient to eat a healthy diet with plenty of whole grains, cereals, fruits and vegetables, exercise regularly and maintain ideal body weight.  Followup in the future with me in 3 months or call earlier if needed       Thank you for coming to see [place] at [facility]. I hope we have been able to provide you high quality care today.  You may receive a patient satisfaction survey over the next few weeks. We would appreciate your feedback and comments so that we may continue to improve ourselves and the health of our patients.

## 2020-04-11 NOTE — Progress Notes (Signed)
[facility] [address] [place]. [place] [address] [phone]       HOSPITAL FOLLOW UP NOTE  [name] Date of Birth:  [birth year] Medical Record Number:  [id number]   Reason for Referral:  hospital stroke follow up    SUBJECTIVE:   CHIEF COMPLAINT:  Chief Complaint  Patient presents with  . Follow-up    Rm 5, from [facility] possible d/c monday or this weekend).     HPI:   [name] is a 86 y.o. female with history of recent left PCA stroke 01/2020 at [facility], hypertension, glaucoma, diabetes, chronic kidney disease stage II d/c to SNF 6/10 from [facility] after suffering a broken R arm with noted worsening stroke sx (R facial droop, slurred speech R sided weakness and increased confusion),  who presented to [facility] on 03/06/2020 with continued worsening stroke symptoms.  Stroke work-up showed extension of left PCA infarct secondary to left PCA occlusion, infarct likely embolic secondary to unknown source.  Recommended 30-day cardiac event monitor outpatient to assess for atrial fibrillation as potential etiology.  Recommended DAPT for 3 months and aspirin alone due to left PCA occlusion.  HTN stable long-term BP goal 130-130.  LDL 135 and increase atorvastatin from 40 mg to 80 mg daily.  Uncontrolled DM with A1c 7.3.  Other stroke risk factors include recent L PCA stroke 01/2020 (evaluated at [facility]) and advanced age.  Evaluated by therapies and recommend return to SNF for ongoing therapy needs.  Stroke:   Extension of L PCA infarct secondary to L PCA occlusion, infarct likely embolic d/t unknown source  MRI  Acute extension of subacute L PCA infarct - L cerebral peduncle, lateral thalamus, splenium of corpus callosum. Diffuse small vessel disease throughout.   MRA  L PCA occlusion w/ diffuse small and medium vessel disease.   Carotid Doppler unremarkable  LE doppler no DVT  Patient not good candidate for loop  recorder, will recommend 30-day cardiac event monitor as outpatient to rule out A. fib as outpatient  LDL 175 01/2020  HgbA1c 7.3 01/2020  Lovenox 30 mg sq daily for VTE prophylaxis  aspirin 325 mg daily prior to admission, now on aspirin 325 mg daily and clopidogrel 75 mg daily. Continue DAPT x 3 months then aspirin alone given left PCA occlusion  Therapy recommendations: anticipate return to SNF   Disposition:  SNF  Today, 04/11/2020, Yvonne Henderson is being seen for hospital follow-up accompanied by her granddaughter.  Currently residing at [facility] currently receiving therapies with residual right hemiparesis and cognitive impairment.  Granddaughter does report improvement but continues to have limited right-sided mobility and is nonambulatory.  Independent prior to strokes.  Right arm in splint due to fracture with concerns of increased swelling and redness. Per granddaughter, attempted to contact orthopedics or have facility further evaluate but has been unsuccessful.  Plans on being discharged home with home health on Monday.  Remains on DAPT without bleeding or bruising.  Continues on atorvastatin without myalgias.  Blood pressure today 143/82.  Glucose levels stable.  No further concerns at this time.      ROS:   14 system review of systems performed and negative with exception of weakness, confusion, memory loss, pain, swelling  PMH:  Past Medical History:  Diagnosis Date  . Anemia associated with chronic renal failure    Provided by family  . Benign essential HTN   . CKD (chronic kidney disease) stage 2, GFR 60-89  ml/min   . Diabetes mellitus without complication (HCC)   . Glaucoma   . Hypertension   . Osteopenia   . Stroke [facility])     PSH:  Past Surgical History:  Procedure Laterality Date  . ABDOMINAL HYSTERECTOMY      Social History:  Social History   Socioeconomic History  . Marital status: Single    Spouse name: Not on file  . Number of children: Not  on file  . Years of education: Not on file  . Highest education level: Not on file  Occupational History  . Not on file  Tobacco Use  . Smoking status: Never Smoker  . Smokeless tobacco: Never Used  Substance and Sexual Activity  . Alcohol use: Not Currently  . Drug use: Not Currently  . Sexual activity: Not on file  Other Topics Concern  . Not on file  Social History Narrative  . Not on file   Social Determinants of Health   Financial Resource Strain:   . Difficulty of Paying Living Expenses:   Food Insecurity:   . Worried About Programme researcher, broadcasting/film/video in the Last Year:   . Barista in the Last Year:   Transportation Needs:   . Freight forwarder (Medical):   Marland Kitchen Lack of Transportation (Non-Medical):   Physical Activity:   . Days of Exercise per Week:   . Minutes of Exercise per Session:   Stress:   . Feeling of Stress :   Social Connections:   . Frequency of Communication with Friends and Family:   . Frequency of Social Gatherings with Friends and Family:   . Attends Religious Services:   . Active Member of Clubs or Organizations:   . Attends Banker Meetings:   Marland Kitchen Marital Status:   Intimate Partner Violence:   . Fear of Current or Ex-Partner:   . Emotionally Abused:   Marland Kitchen Physically Abused:   . Sexually Abused:     Family History:  Family History  Problem Relation Age of Onset  . Hypertension Mother   . Hypertension Father     Medications:   Current Outpatient Medications on File Prior to Visit  Medication Sig Dispense Refill  . Acetaminophen (TYLENOL PO) Take 1,000 mg by mouth in the morning and at bedtime. For pain    . amLODipine (NORVASC) 10 MG tablet Take 10 mg by mouth daily.    Marland Kitchen aspirin EC 325 MG EC tablet Take 1 tablet (325 mg total) by mouth daily. 30 tablet 0  . atorvastatin (LIPITOR) 40 MG tablet Take 40 mg by mouth at bedtime.    . clopidogrel (PLAVIX) 75 MG tablet Take 1 tablet (75 mg total) by mouth daily. 30 tablet 0  .  FERREX 150 150 MG capsule Take 150 mg by mouth daily.    Marland Kitchen LANTUS SOLOSTAR 100 UNIT/ML Solostar Pen Inject 21 Units into the skin in the morning.     . latanoprost (XALATAN) 0.005 % ophthalmic solution Place 1 drop into both eyes daily.    Marland Kitchen levothyroxine (SYNTHROID) 88 MCG tablet Take 88 mcg by mouth daily.    . potassium chloride SA (KLOR-CON) 20 MEQ tablet Take 40 mEq by mouth once.    . timolol (TIMOPTIC) 0.5 % ophthalmic solution Place 1 drop into both eyes daily.     . Vitamin D, Ergocalciferol, (DRISDOL) 1.25 MG (50000 UNIT) CAPS capsule Take 50,000 Units by mouth 2 (two) times a week.     No  current facility-administered medications on file prior to visit.    Allergies:  No Known Allergies    OBJECTIVE:  Physical Exam  Vitals:   04/11/20 1452  BP: (!) 143/82  Pulse: (!) 108  Height: 4\' 11"  (1.499 m)   Body mass index is 26.26 kg/m. No exam data present  General: well developed, well nourished, very pleasant elderly African-American female, seated, in no evident distress Head: head normocephalic and atraumatic.   Neck: supple with no carotid or supraclavicular bruits Cardiovascular: regular rate and rhythm, no murmurs Musculoskeletal: no deformity; limited ROM right proximal arm currently in splint with 3+ pitting edema, redness, warmth and subjectively pain. Skin:  no rash/petichiae Vascular:  Normal pulses all extremities   Neurologic Exam Mental Status: Awake and fully alert.   Fluent speech and language.  Oriented to place and time. Recent impaired and remote memory intact. Attention span, concentration and fund of knowledge moderately impaired. Mood and affect appropriate.  Cranial Nerves: Fundoscopic exam reveals sharp disc margins. Pupils equal, briskly reactive to light. Extraocular movements full without nystagmus. Visual fields likely right hemianopia as no blink to threat on right.  HOH bilaterally. Facial sensation intact.  Right lower facial weakness.   Tongue and palate moves normally and symmetrically.  Motor: Full strength left upper and lower extremity RUE: Unable to adequately test strength due to increased pain and fracture currently in splint; able to move arm minimally but no movement of fingers. RLE: 2/5 Sensory.: intact to touch , pinprick , position and vibratory sensation.  Coordination: Rapid alternating movements normal on left side. Finger-to-nose and heel-to-shin intact on L side. Gait and Station: deferred - patient nonambulatory  Reflexes: 1+ and symmetric. Toes downgoing.     NIHSS  8 Modified Rankin  4    ASSESSMENT: Yvonne Henderson is a 86 y.o. year old female with history of left PCA stroke 01/2020 (evaluated at [facility]) presenting on 03/06/2020 with worsening of prior stroke deficits (right facial droop, slurred speech, right-sided weakness and increased confusion) with stroke work-up revealing extension of left PCA infarct secondary to left PCA occlusion, infarct likely embolic due to unknown source. Vascular risk factors include HTN, HLD, DM, recent stroke, and advanced age.      PLAN:  1. Left PCA stroke with extension: Residual deficits: Cognitive impairment, right hemianopia, right hemiparesis and right facial droop.  Continue therapies at SNF and initiate home health therapies at discharge next week.  Continue aspirin 325 mg daily and clopidogrel 75 mg daily  and atorvastatin 80 mg daily for secondary stroke prevention.  Continue DAPT for additional 2 months and then aspirin alone.  Recently completed 30-day cardiac event monitor currently awaiting results.  Close PCP follow-up for aggressive stroke risk factor management. 2. R midshaft femoral fracture: Post fall on 02/28/2020.  Currently in splint.  Moderate amount of swelling throughout right arm with increased pain, redness and warmth. Will reach out to orthopedics in regards to possible need of ultrasound to rule out DVT. 3. HTN: BP goal<130/90.  Stable today.   Continue to monitor at home and follow-up with PCP 4. HLD: LDL goal less than 70.  Continue atorvastatin 80 mg daily and close PCP follow-up 5. DMII: A1c goal<7.  Continue to follow with PCP    Follow up in 3 months or call earlier if needed   I spent 45 minutes of face-to-face and non-face-to-face time with patient and granddaughter.  This included previsit chart review, lab review, study review, order entry, electronic health  record documentation, patient education regarding recent stroke, residual deficits, importance of managing stroke risk factors and answered all questions to patient satisfaction   Ihor Austin, [facility]  [facility] [address] [place], [place] [address]  Phone [phone] Fax [phone] Note: This document was prepared with digital dictation and possible smart phrase technology. Any transcriptional errors that result from this process are unintentional.

## 2020-04-12 ENCOUNTER — Ambulatory Visit (INDEPENDENT_AMBULATORY_CARE_PROVIDER_SITE_OTHER): Payer: Medicare PPO | Admitting: Orthopedic Surgery

## 2020-04-12 ENCOUNTER — Ambulatory Visit (INDEPENDENT_AMBULATORY_CARE_PROVIDER_SITE_OTHER): Payer: Medicare PPO

## 2020-04-12 DIAGNOSIS — S42301A Unspecified fracture of shaft of humerus, right arm, initial encounter for closed fracture: Secondary | ICD-10-CM | POA: Diagnosis not present

## 2020-04-13 ENCOUNTER — Encounter: Payer: Self-pay | Admitting: Orthopedic Surgery

## 2020-04-13 NOTE — Progress Notes (Signed)
° °  Post-Op Visit Note   Patient: Yvonne Henderson           Date of Birth: [birth year]           MRN: [id number] Visit Date: 04/12/2020 PCP: Suzan Slick, MD   Assessment & Plan:  Chief Complaint:  Chief Complaint  Patient presents with   Follow-up   Visit Diagnoses:  1. Closed fracture of shaft of right humerus, unspecified fracture morphology, initial encounter     Plan: Yvonne Henderson is a 86 year old patient is now about 6 weeks out right humeral shaft fracture.  She is having some swelling in her arm.  About 8 weeks out from injury.  Sarmiento brace has been used.  Still having some swelling in the arm.  She is had a stroke which is affected that right-hand side.  She also has other medical comorbidities including osteopenia and anemia associated with chronic renal failure.  She also has a history of diabetes.  On exam she has diminished motor function consistent with stroke.  There is some motion at the fracture site as well.  Some swelling is present in the hand and arm which is I think dependent more on the Sarmiento brace.  Plan at this time is to DC the Sarmiento brace but add bone stimulator to try to get this thing to fully heal.  She does not have full union at this time and there is some motion at the fracture site.  Would like to try to get full healing.  We will see her back in about a month for repeat radiographs and recheck.  Bone stimulator ordered and requested.  I think that would help her to avoid surgery which would be high risk in her case.  Follow-Up Instructions: Return in about 4 weeks (around 05/10/2020).   Orders:  Orders Placed This Encounter  Procedures   XR Humerus Right   No orders of the defined types were placed in this encounter.   Imaging: No results found.  PMFS History: Patient Active Problem List   Diagnosis Date Noted   Hyperlipidemia 03/28/2020   CVA (cerebral vascular accident) (HCC) 03/06/2020   Anemia associated with chronic renal  failure    Glaucoma    Osteopenia    Essential hypertension    Past Medical History:  Diagnosis Date   Anemia associated with chronic renal failure    Provided by family   Benign essential HTN    CKD (chronic kidney disease) stage 2, GFR 60-89 ml/min    Diabetes mellitus without complication (HCC)    Glaucoma    Hypertension    Osteopenia    Stroke (HCC)     Family History  Problem Relation Age of Onset   Hypertension Mother    Hypertension Father     Past Surgical History:  Procedure Laterality Date   ABDOMINAL HYSTERECTOMY     Social History   Occupational History   Not on file  Tobacco Use   Smoking status: Never Smoker   Smokeless tobacco: Never Used  Substance and Sexual Activity   Alcohol use: Not Currently   Drug use: Not Currently   Sexual activity: Not on file

## 2020-04-16 ENCOUNTER — Ambulatory Visit: Payer: Medicare PPO | Admitting: Orthopedic Surgery

## 2020-04-19 ENCOUNTER — Telehealth: Payer: Self-pay

## 2020-04-19 ENCOUNTER — Telehealth (HOSPITAL_COMMUNITY): Payer: Self-pay | Admitting: Cardiovascular Disease

## 2020-04-19 ENCOUNTER — Telehealth: Payer: Self-pay | Admitting: Orthopedic Surgery

## 2020-04-19 NOTE — Telephone Encounter (Signed)
Ok for elbow and wrist rom hold off on shoulder rom thx

## 2020-04-19 NOTE — Telephone Encounter (Signed)
Received call from Anibal Henderson (PT) with [facility] needing to know what precautions patient has for her right arm? The number to contact Marcelino Duster is [phone]

## 2020-04-19 NOTE — Telephone Encounter (Signed)
Pls advise.  

## 2020-04-19 NOTE — Telephone Encounter (Signed)
Patient cancelled echocardiogram by automated system and Tammy the Tech called and patient confirmed cancellation with her.  Patient states she will call back at a later time to reschedule.  Order will be removed from the WQ and when she calls back to reschedule I will reinstate the order.

## 2020-04-19 NOTE — Telephone Encounter (Signed)
Bernette Redbird with [employer] sent photo when he went to apply bone stim today for patient of the hand swelling. I forwarded to Dr August Saucer who recommended compression. I called and left detailed VM for her grandaughter advising per Dr August Saucer to wrap hand/wrist with ace then apply the compression arm sleeve that they already had.

## 2020-04-20 NOTE — Telephone Encounter (Signed)
Called and sw HHN to advise of message below.

## 2020-04-20 NOTE — Telephone Encounter (Signed)
See below. Can you call?

## 2020-04-23 ENCOUNTER — Other Ambulatory Visit (HOSPITAL_COMMUNITY): Payer: Medicare PPO

## 2020-04-24 ENCOUNTER — Telehealth: Payer: Self-pay | Admitting: Orthopedic Surgery

## 2020-04-24 NOTE — Telephone Encounter (Signed)
Occupational Therapist Mallory from [email or web address]  called requesting a call back. Mallory needs instructions from Dr. August Saucer on OT session of exercises for patient (range of motion, stretch fingers ect.). Please call Mallory at [phone]

## 2020-04-24 NOTE — Telephone Encounter (Signed)
Pls advise. Thanks.  

## 2020-04-25 ENCOUNTER — Other Ambulatory Visit: Payer: Self-pay | Admitting: Physician Assistant

## 2020-04-25 DIAGNOSIS — I63432 Cerebral infarction due to embolism of left posterior cerebral artery: Secondary | ICD-10-CM

## 2020-04-25 DIAGNOSIS — I639 Cerebral infarction, unspecified: Secondary | ICD-10-CM

## 2020-04-25 DIAGNOSIS — I4891 Unspecified atrial fibrillation: Secondary | ICD-10-CM

## 2020-04-25 NOTE — Telephone Encounter (Signed)
I called lmom

## 2020-05-21 MED FILL — Amlodipine Besylate Tab 5 MG (Base Equivalent): ORAL | Qty: 10 | Status: AC

## 2020-05-21 MED FILL — Polysaccharide Iron Complex Cap 150 MG (Iron Equivalent): ORAL | Qty: 150 | Status: AC

## 2020-05-21 MED FILL — Levothyroxine Sodium Tab 88 MCG: ORAL | Qty: 88 | Status: AC

## 2020-05-21 MED FILL — Hydrochlorothiazide Tab 25 MG: ORAL | Qty: 25 | Status: AC

## 2020-05-21 MED FILL — Furosemide Tab 20 MG: ORAL | Qty: 20 | Status: AC

## 2020-05-21 MED FILL — Timolol Maleate Ophth Soln 0.5%: OPHTHALMIC | Qty: 5 | Status: AC

## 2020-05-21 MED FILL — Glipizide Tab 5 MG: ORAL | Qty: 2.5 | Status: AC

## 2020-05-21 MED FILL — Acetaminophen Tab 500 MG: ORAL | Qty: 1000 | Status: AC

## 2020-05-22 ENCOUNTER — Telehealth: Payer: Self-pay | Admitting: Orthopedic Surgery

## 2020-05-22 NOTE — Telephone Encounter (Signed)
noted 

## 2020-05-22 NOTE — Telephone Encounter (Signed)
Malerie with [facility] called wanting a CB in regards to the swelling in the pts arm  Malerie CB# [phone]

## 2020-05-22 NOTE — Telephone Encounter (Signed)
I called and spoke with Mallory, OT with [facility]. She states that patient continues to have swelling of her upper extremity even with wearing the compression sleeve and glove at all times ( patient is sleeping in them).  Mallory's assistant felt that when he saw patient this was better, but she did not like the way that it looked today. She states that the fluid is pooling in different places but she is able to do some retrograde massage and move it around.  She also says that there is confusion in regards to the sling and if the patient is supposed to wear it. The family told her that at the last visit, they were told she did not need it. Mallory does feel that there is a very mild subluxing of the shoulder and has informed the family to keep the arm on a pillow.  Patient has follow up appt scheduled for Thursday, however, Mallory wanted to be sure you knew how things look today.  Please let me know if you need for me to do anything for patient prior to appt on Thursday.

## 2020-05-22 NOTE — Telephone Encounter (Signed)
We can wait it out - ok to dc sling we are waiting to see if this heals on its own or if it will require surgery

## 2020-05-24 ENCOUNTER — Other Ambulatory Visit: Payer: Self-pay

## 2020-05-24 ENCOUNTER — Ambulatory Visit (INDEPENDENT_AMBULATORY_CARE_PROVIDER_SITE_OTHER): Payer: Medicare PPO

## 2020-05-24 ENCOUNTER — Encounter: Payer: Self-pay | Admitting: Orthopaedic Surgery

## 2020-05-24 ENCOUNTER — Ambulatory Visit (INDEPENDENT_AMBULATORY_CARE_PROVIDER_SITE_OTHER): Payer: Medicare PPO | Admitting: Orthopaedic Surgery

## 2020-05-24 VITALS — BP 140/77 | Ht <= 58 in | Wt 130.0 lb

## 2020-05-24 DIAGNOSIS — S42309A Unspecified fracture of shaft of humerus, unspecified arm, initial encounter for closed fracture: Secondary | ICD-10-CM | POA: Insufficient documentation

## 2020-05-24 DIAGNOSIS — S42351G Displaced comminuted fracture of shaft of humerus, right arm, subsequent encounter for fracture with delayed healing: Secondary | ICD-10-CM

## 2020-05-24 DIAGNOSIS — S42301A Unspecified fracture of shaft of humerus, right arm, initial encounter for closed fracture: Secondary | ICD-10-CM

## 2020-05-24 NOTE — Progress Notes (Signed)
Office Visit Note   Patient: Yvonne Henderson           Date of Birth: 03-29-1927           MRN: [id number] Visit Date: 05/24/2020              Requested by: Suzan Slick, MD [address] [place],  [place] [address] PCP: Suzan Slick, MD   Assessment & Plan: Visit Diagnoses:  1. Closed fracture of shaft of right humerus, unspecified fracture morphology, initial encounter   2. Closed displaced comminuted fracture of shaft of right humerus with delayed healing, subsequent encounter     Plan: Continue stimulator repeat x-rays 5 weeks.  Her fracture appears to be consolidating.  He will continue to work on finger range of motion and wrist range of motion.  Repeat x-rays on office follow-up 5 weeks.  Continue bone stimulator use.  Follow-Up Instructions: Return in about 5 weeks (around 06/28/2020).   Orders:  Orders Placed This Encounter  Procedures  . XR Humerus Right   No orders of the defined types were placed in this encounter.     Procedures: No procedures performed   Clinical Data: No additional findings.   Subjective: Chief Complaint  Patient presents with  . Right Upper Arm - Follow-up, Fracture    HPI 86-year-old female with right dominant humeral shaft fracture originally treated with Sarmiento brace now with bone stimulator.  X-rays today demonstrate some interval healing she still has some lucency but moves as 1 unit.  She is not really having any pain but has considerable swelling stiffness in her hand and family is with her today and has been working on finger range of motion and wrist range of motion with her.  Previous CVA also hypertension glaucoma and hyperlipidemia are additional problems she has.  Review of Systems 14 point systems noncontributory other than as mentioned in HPI.   Objective: Vital Signs: BP 140/77   Ht 4\' 9"  (1.448 m)   Wt 130 lb (59 kg)   BMI 28.13 kg/m   Physical Exam Constitutional:      Appearance: She is  well-developed.  HENT:     Head: Normocephalic.     Right Ear: External ear normal.     Left Ear: External ear normal.  Eyes:     Pupils: Pupils are equal, round, and reactive to light.  Neck:     Thyroid: No thyromegaly.     Trachea: No tracheal deviation.  Cardiovascular:     Rate and Rhythm: Normal rate.  Pulmonary:     Effort: Pulmonary effort is normal.  Abdominal:     Palpations: Abdomen is soft.  Skin:    General: Skin is warm and dry.  Neurological:     Mental Status: She is alert and oriented to person, place, and time.  Psychiatric:        Behavior: Behavior normal.     Ortho Exam patient has right arm and right leg weakness related to previous CVA.  She is here with the grandchildren.  Patient is mobile with a wheelchair.  And edema finger edema noted.  She lacks 4 to 5 cm touching distal palmar crease.  After few minutes of pressure she has about 50% gripping range of motion.  Radial sensory nerve is active and she can extend her thumb slightly with weakness.  Proximal and distal humerus moved as 1 unit with rotation and flexion extension and abduction.  She has shoulder stiffness  as expected.  Specialty Comments:  No specialty comments available.  Imaging: No results found.   PMFS History: Patient Active Problem List   Diagnosis Date Noted  . Humerus shaft fracture 05/24/2020  . Hyperlipidemia 03/28/2020  . CVA (cerebral vascular accident) (HCC) 03/06/2020  . Anemia associated with chronic renal failure   . Glaucoma   . Osteopenia   . Essential hypertension    Past Medical History:  Diagnosis Date  . Anemia associated with chronic renal failure    Provided by family  . Benign essential HTN   . CKD (chronic kidney disease) stage 2, GFR 60-89 ml/min   . Diabetes mellitus without complication (HCC)   . Glaucoma   . Hypertension   . Osteopenia   . Stroke [facility])     Family History  Problem Relation Age of Onset  . Hypertension Mother   . Hypertension  Father     Past Surgical History:  Procedure Laterality Date  . ABDOMINAL HYSTERECTOMY     Social History   Occupational History  . Not on file  Tobacco Use  . Smoking status: Never Smoker  . Smokeless tobacco: Never Used  Substance and Sexual Activity  . Alcohol use: Not Currently  . Drug use: Not Currently  . Sexual activity: Not on file

## 2020-05-29 ENCOUNTER — Telehealth: Payer: Self-pay | Admitting: Orthopedic Surgery

## 2020-05-29 NOTE — Telephone Encounter (Signed)
IC, no answer. LMVM advising ok for orders

## 2020-05-29 NOTE — Telephone Encounter (Signed)
Malorie from [facility] called.   Requesting verbal orders to extend home health OT 1wk3   Call back: [phone]

## 2020-06-11 ENCOUNTER — Other Ambulatory Visit: Payer: Self-pay

## 2020-06-11 ENCOUNTER — Ambulatory Visit (HOSPITAL_COMMUNITY)
Admission: RE | Admit: 2020-06-11 | Discharge: 2020-06-11 | Disposition: A | Discharge status: Home / Self Care | Payer: Medicare PPO | Source: Ambulatory Visit | Attending: Cardiovascular Disease | Admitting: Cardiovascular Disease

## 2020-06-11 DIAGNOSIS — I63432 Cerebral infarction due to embolism of left posterior cerebral artery: Secondary | ICD-10-CM | POA: Insufficient documentation

## 2020-06-11 LAB — ECHOCARDIOGRAM COMPLETE
AR max vel: 1.71 cm2
AV Area VTI: 2.04 cm2
AV Area mean vel: 1.83 cm2
AV Mean grad: 3.3 mmHg
AV Peak grad: 6.3 mmHg
Ao pk vel: 1.25 m/s
Area-P 1/2: 2.01 cm2
S' Lateral: 2.32 cm

## 2020-06-11 NOTE — Progress Notes (Signed)
*  PRELIMINARY RESULTS* Echocardiogram 2D Echocardiogram has been performed.  Stacey Drain 06/11/2020, 3:00 PM

## 2020-06-12 ENCOUNTER — Telehealth: Payer: Self-pay

## 2020-06-12 NOTE — Telephone Encounter (Signed)
Ok for orders? 

## 2020-06-12 NOTE — Telephone Encounter (Signed)
Sure thx

## 2020-06-12 NOTE — Telephone Encounter (Signed)
malory from [facility] called in for orders for patients   To extend  Home health occupational th\erapy   1 a week for 5 weeks

## 2020-06-13 NOTE — Telephone Encounter (Signed)
I called Mallory and advised. She states that the patient's family has gotten her a resting hand splint for the right hand/wrist and is going to work on training them on how to use it today. She wanted to make sure this is ok. She will not use it all of the time, just occasionally during the day for about 30 minutes at a time. I advised ok to use.

## 2020-06-28 ENCOUNTER — Ambulatory Visit: Payer: Medicare PPO | Admitting: Orthopaedic Surgery

## 2020-06-28 ENCOUNTER — Other Ambulatory Visit: Payer: Self-pay

## 2020-07-18 ENCOUNTER — Ambulatory Visit: Payer: Medicare PPO | Admitting: Adult Health

## 2020-08-02 ENCOUNTER — Ambulatory Visit (INDEPENDENT_AMBULATORY_CARE_PROVIDER_SITE_OTHER): Payer: Medicare PPO

## 2020-08-02 ENCOUNTER — Encounter: Payer: Self-pay | Admitting: Orthopaedic Surgery

## 2020-08-02 ENCOUNTER — Ambulatory Visit (INDEPENDENT_AMBULATORY_CARE_PROVIDER_SITE_OTHER): Payer: Medicare PPO | Admitting: Orthopaedic Surgery

## 2020-08-02 ENCOUNTER — Other Ambulatory Visit: Payer: Self-pay

## 2020-08-02 VITALS — Ht <= 58 in | Wt 130.0 lb

## 2020-08-02 DIAGNOSIS — S42351G Displaced comminuted fracture of shaft of humerus, right arm, subsequent encounter for fracture with delayed healing: Secondary | ICD-10-CM

## 2020-08-02 NOTE — Progress Notes (Signed)
Office Visit Note   Patient: Yvonne Henderson           Date of Birth: [birth year]           MRN: [id number] Visit Date: 08/02/2020              Requested by: Suzan Slick, MD [address] [place],  [place] [address] PCP: Suzan Slick, MD   Assessment & Plan: Visit Diagnoses:  1. Closed displaced comminuted fracture of shaft of right humerus with delayed healing, subsequent encounter     Plan: Patient is healed by clinical exam.  She can begin using her arm elbow range of motion other activities now she is back at home and out of the hospital.  Follow-up with me on an as-needed basis.  X-rays were reviewed with patient and her daughter.  Follow-up here as needed.  Follow-Up Instructions: No follow-ups on file.   Orders:  Orders Placed This Encounter  Procedures  . XR Humerus Right   No orders of the defined types were placed in this encounter.     Procedures: No procedures performed   Clinical Data: No additional findings.   Subjective: Chief Complaint  Patient presents with  . Right Upper Arm - Fracture, Follow-up    DOI 02/28/2020    HPI 86 year old female post previous CVA with fall and humeral shaft fracture 02/28/2020 on the right side.  Treated with Sarmiento followed by bone stimulator.  I have not seen her since September partially due to problems with gallbladder sepsis requiring hospitalization.  She had a bone stimulator she was using but in the interval while she is in the hospital was not using it.  Today she returns denies having pain at the fracture site and is not in a brace.  She is not using a sling.  Review of Systems all other systems update unchanged from 05/24/2020 other than as mentioned above.   Objective: Vital Signs: Ht 4\' 9"  (1.448 m)   Wt 130 lb (59 kg)   BMI 28.13 kg/m   Physical Exam General exam unchanged.  Ortho Exam right arm and right leg weakness secondary to previous CVA unchanged.  Edema of the fingertips is  decreased.  Humeral shaft has callus palpable with some biceps triceps atrophy.  Proximal distal fragments move in 1 piece both with flexion extension as well as medial lateral deviation and also with rotation.  Specialty Comments:  No specialty comments available.  Imaging: No results found.   PMFS History: Patient Active Problem List   Diagnosis Date Noted  . Humerus shaft fracture 05/24/2020  . Hyperlipidemia 03/28/2020  . CVA (cerebral vascular accident) (HCC) 03/06/2020  . Anemia associated with chronic renal failure   . Glaucoma   . Osteopenia   . Essential hypertension    Past Medical History:  Diagnosis Date  . Anemia associated with chronic renal failure    Provided by family  . Benign essential HTN   . CKD (chronic kidney disease) stage 2, GFR 60-89 ml/min   . Diabetes mellitus without complication (HCC)   . Glaucoma   . Hypertension   . Osteopenia   . Stroke [facility])     Family History  Problem Relation Age of Onset  . Hypertension Mother   . Hypertension Father     Past Surgical History:  Procedure Laterality Date  . ABDOMINAL HYSTERECTOMY     Social History   Occupational History  . Not on file  Tobacco Use  .  Smoking status: Never Smoker  . Smokeless tobacco: Never Used  Substance and Sexual Activity  . Alcohol use: Not Currently  . Drug use: Not Currently  . Sexual activity: Not on file

## 2020-08-03 ENCOUNTER — Telehealth: Payer: Self-pay

## 2020-08-03 NOTE — Telephone Encounter (Signed)
I called and left message . Does not need any therapy , she can use arm as pt desires. FYI

## 2020-08-03 NOTE — Telephone Encounter (Signed)
Mallory from [facility] called she is requesting  verbal orders on what needs to be done as far as home health. Call [phone]

## 2020-08-03 NOTE — Telephone Encounter (Signed)
Can you advise? 

## 2021-06-07 IMAGING — MR MR MRA HEAD W/O CM
1 series · 19 of 48 positions shown · non-contrast
Comparison: MRI same day

CLINICAL DATA: Slurred speech. Extension of left PCA territory
stroke.

EXAM:
MRA HEAD WITHOUT CONTRAST
TECHNIQUE: Angiographic images of the Circle of Willis were obtained using MRA
technique without intravenous contrast.

[Series 2: ax (id) · axial · 1.0mm · 0.43mm/px · z∈[-73,+13]mm · 19 of 184 slices shown]
[im 1/184]
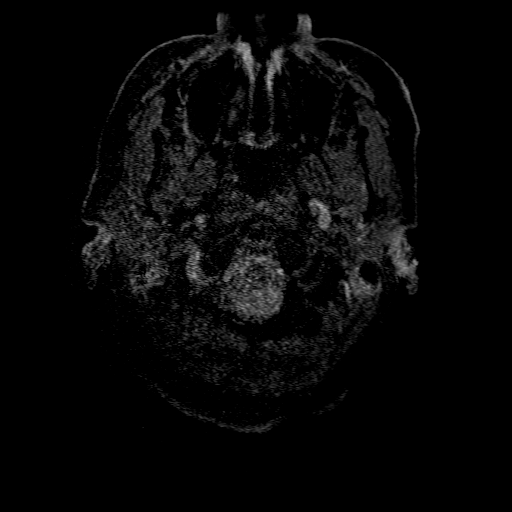
[im 4/184]
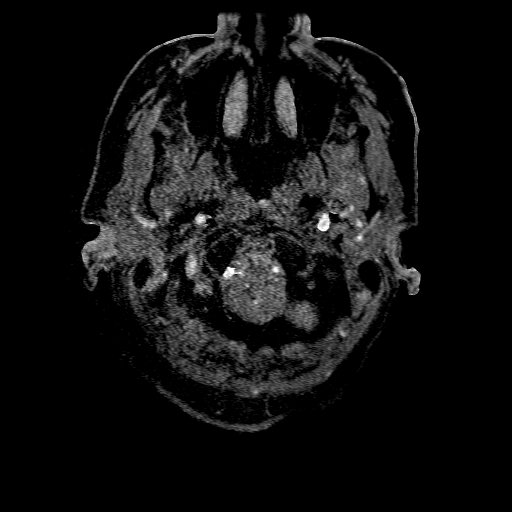
[im 8/184]
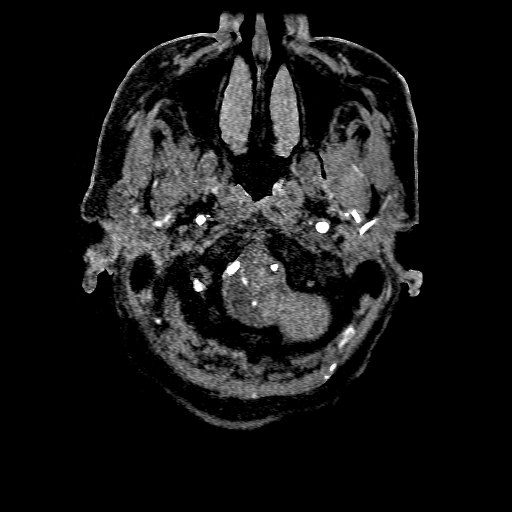
[im 12/184]
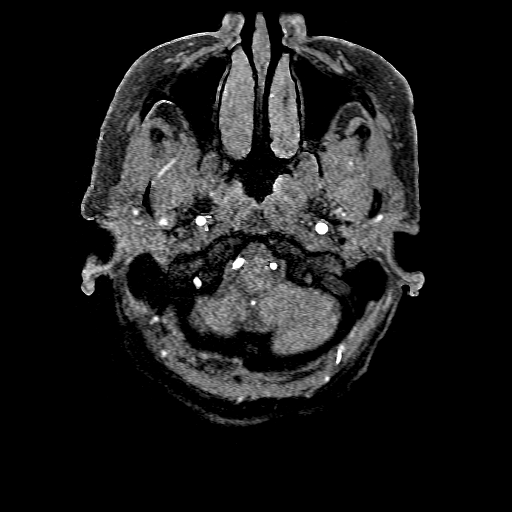
[im 16/184]
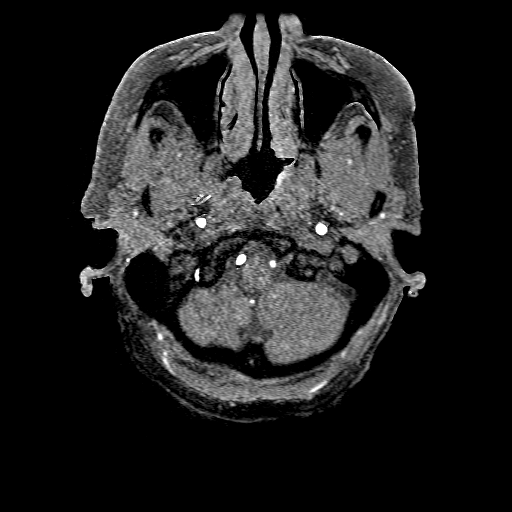
[im 20/184]
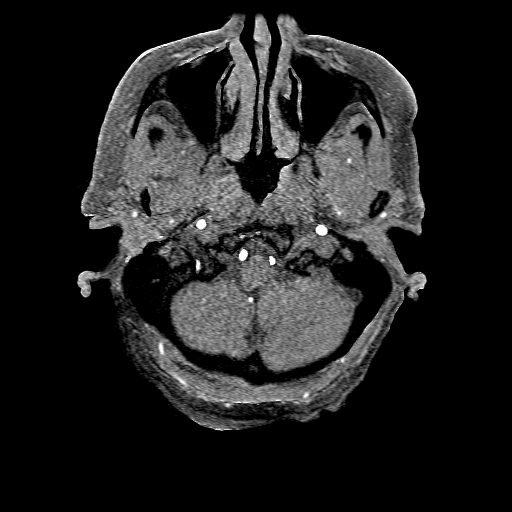
[im 24/184]
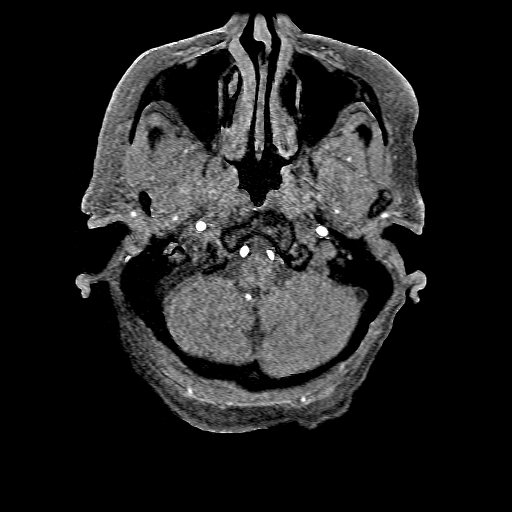
[im 28/184]
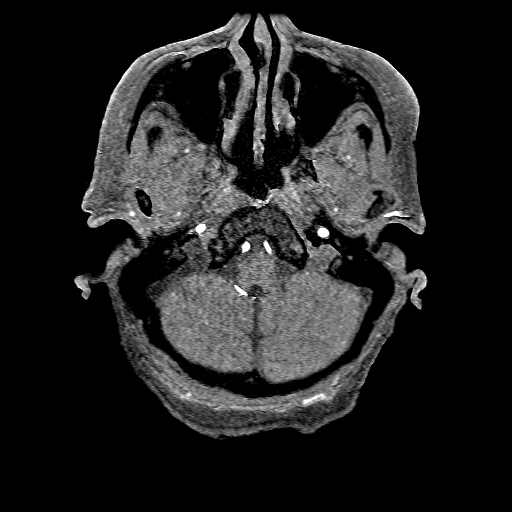
[im 32/184]
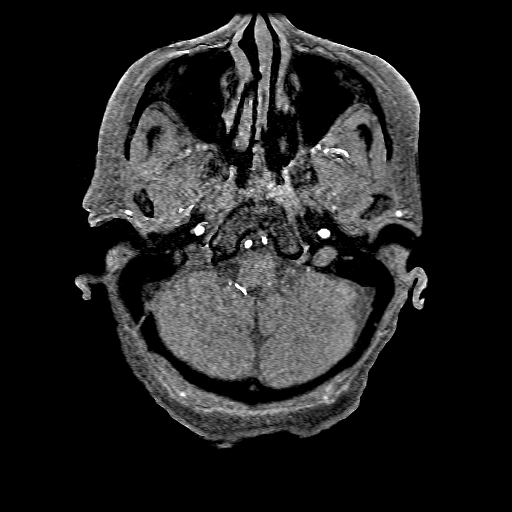
[im 36/184]
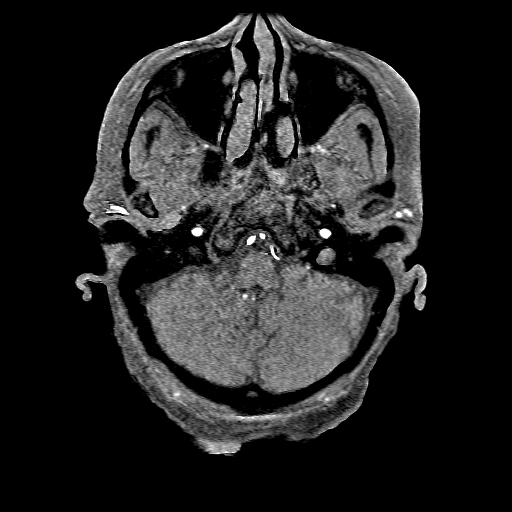
[im 39/184]
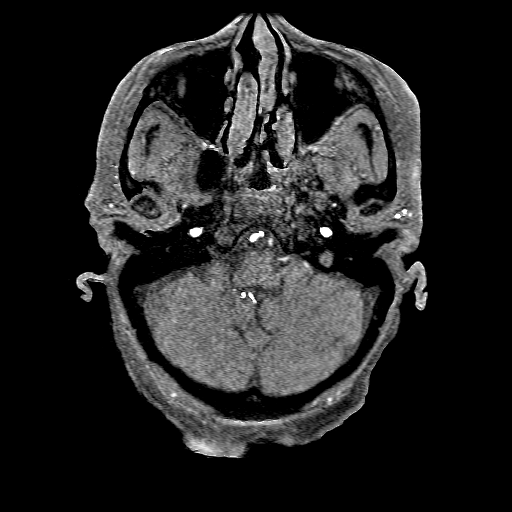
[im 59/184]
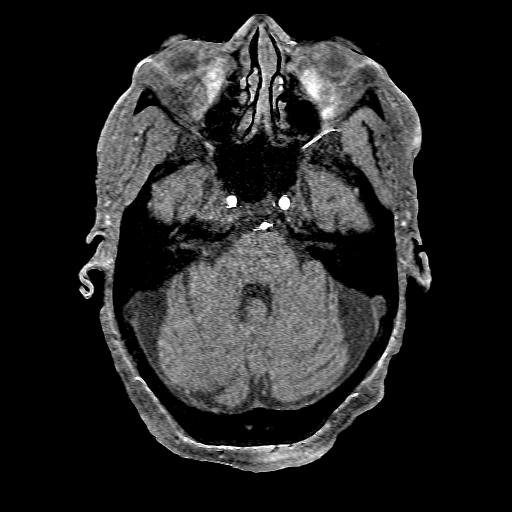
[im 82/184]
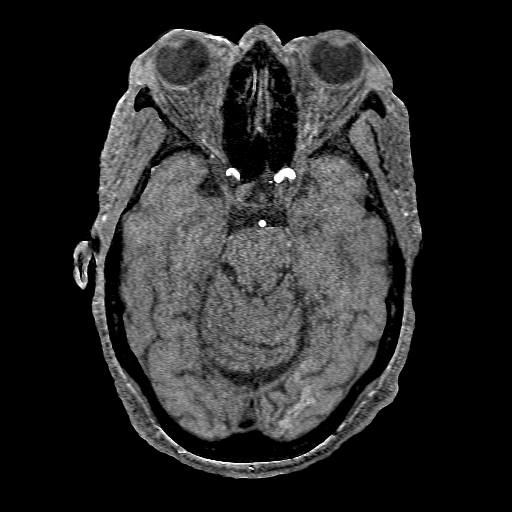
[im 94/184]
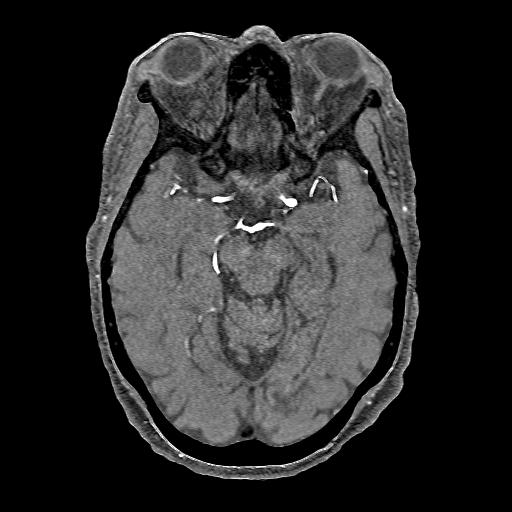
[im 106/184]
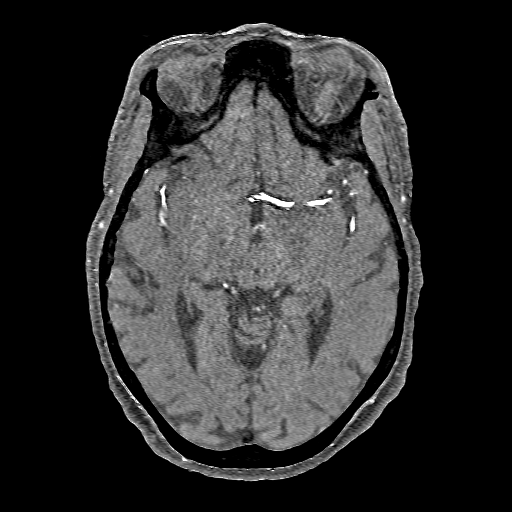
[im 129/184]
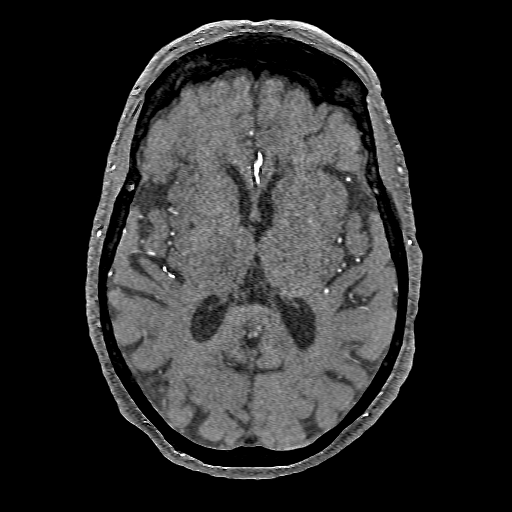
[im 152/184]
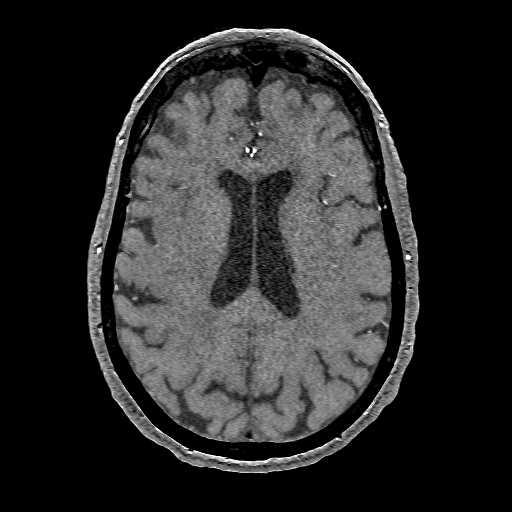
[im 156/184]
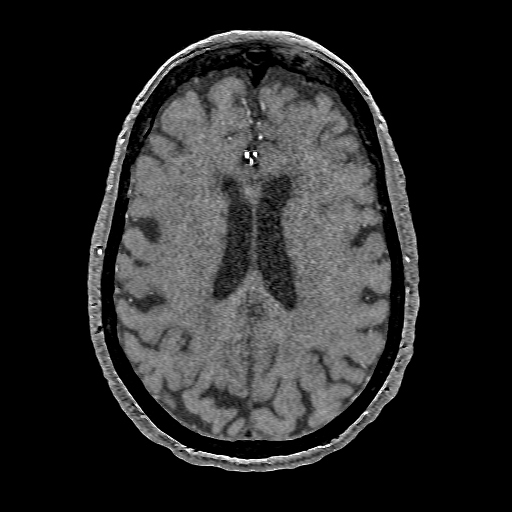
[im 176/184]
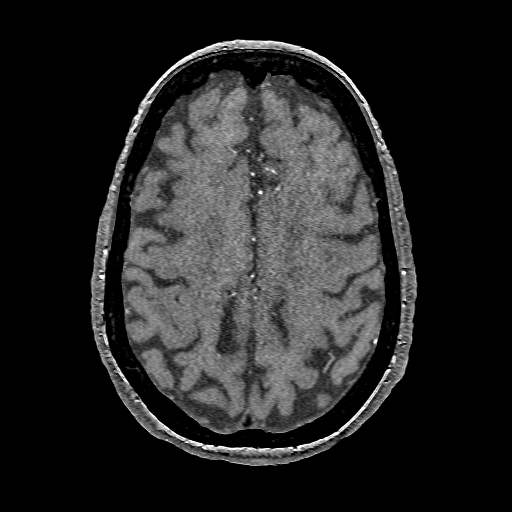

[19 of 48 positions shown; findings below may reference images not displayed]

FINDINGS: Both internal carotid arteries are patent through the skull base and
siphon regions. On the right, the anterior and middle cerebral
vessels show flow but appear to show diffuse atherosclerotic
irregularity. 50% stenosis in the M1 M2 junction region. On the
left, the anterior and middle cerebral vessels are patent. There is
some distal vessel atherosclerotic irregularity, though not as
severe as on the right.

Both vertebral arteries are patent to the basilar with the right
being dominant. Dominant right posteroinferior cerebellar artery.
Both vertebral arteries reach the basilar. No basilar stenosis.
Dominant left anterior inferior cerebellar artery. Both superior
cerebellar arteries show flow. Right PCA is patent with pronounced
atherosclerotic irregularity. Left PCA is occluded less than 1 cm
beyond the origin.
IMPRESSION: Left PCA occlusion.

Diffuse intracranial atherosclerotic disease of the medium to small
vessels as outlined above.

## 2021-06-07 IMAGING — MR MR HEAD W/O CM
6 of 10 series · 27 of 48 positions shown · non-contrast
Comparison: 02/01/2020

CLINICAL DATA: Slurred speech beginning 5 days ago.

EXAM:
MRI HEAD WITHOUT CONTRAST
TECHNIQUE: Multiplanar, multiecho pulse sequences of the brain and surrounding
structures were obtained without intravenous contrast.

[Series 2: DWI · axial · 3.0mm · 0.94mm/px · z∈[-99,+47]mm · 8 of 100 slices shown (1 of 2)]
[im 1/100]
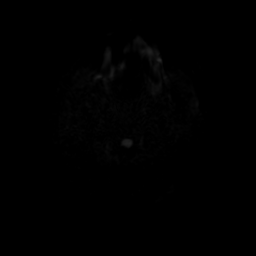
[im 12/100]
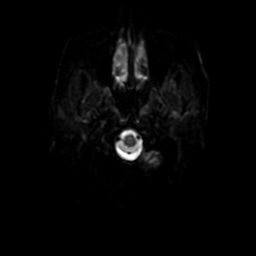
[im 34/100]
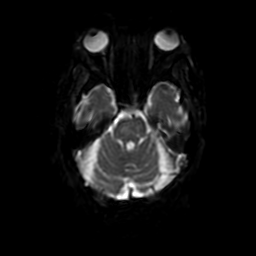
[im 45/100]
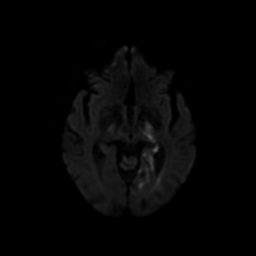
[im 56/100]
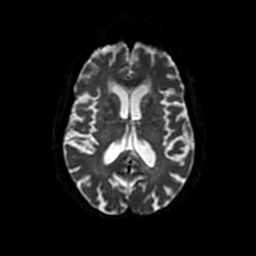
[im 67/100]
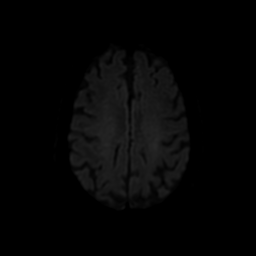
[im 89/100]
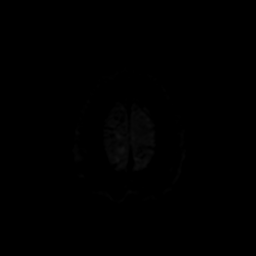
[im 100/100]
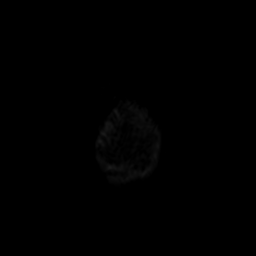

[Series 3: DWI · coronal · 4.0mm · 0.94mm/px · 7 of 74 slices shown (2 of 2)]
[im 1/74]
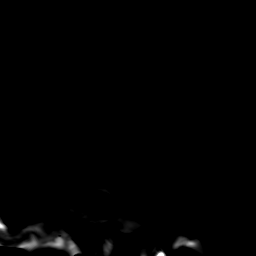
[im 13/74]
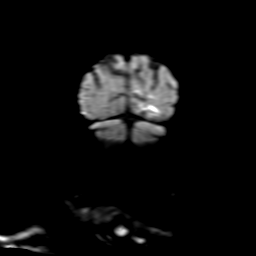
[im 25/74]
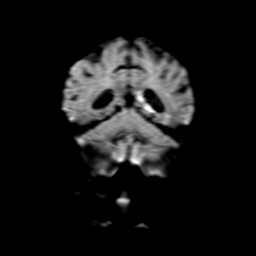
[im 37/74]
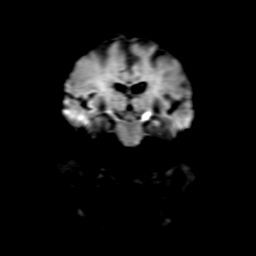
[im 49/74]
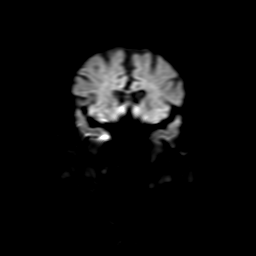
[im 61/74]
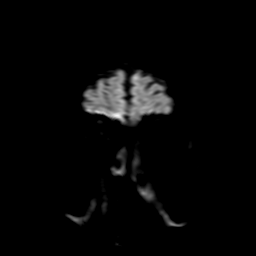
[im 74/74]
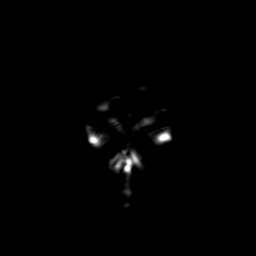

[Series 4: FLAIR · sagittal · 5.0mm · 0.23mm/px · 2 of 23 slices shown (1 of 2)]
[im 1/23]
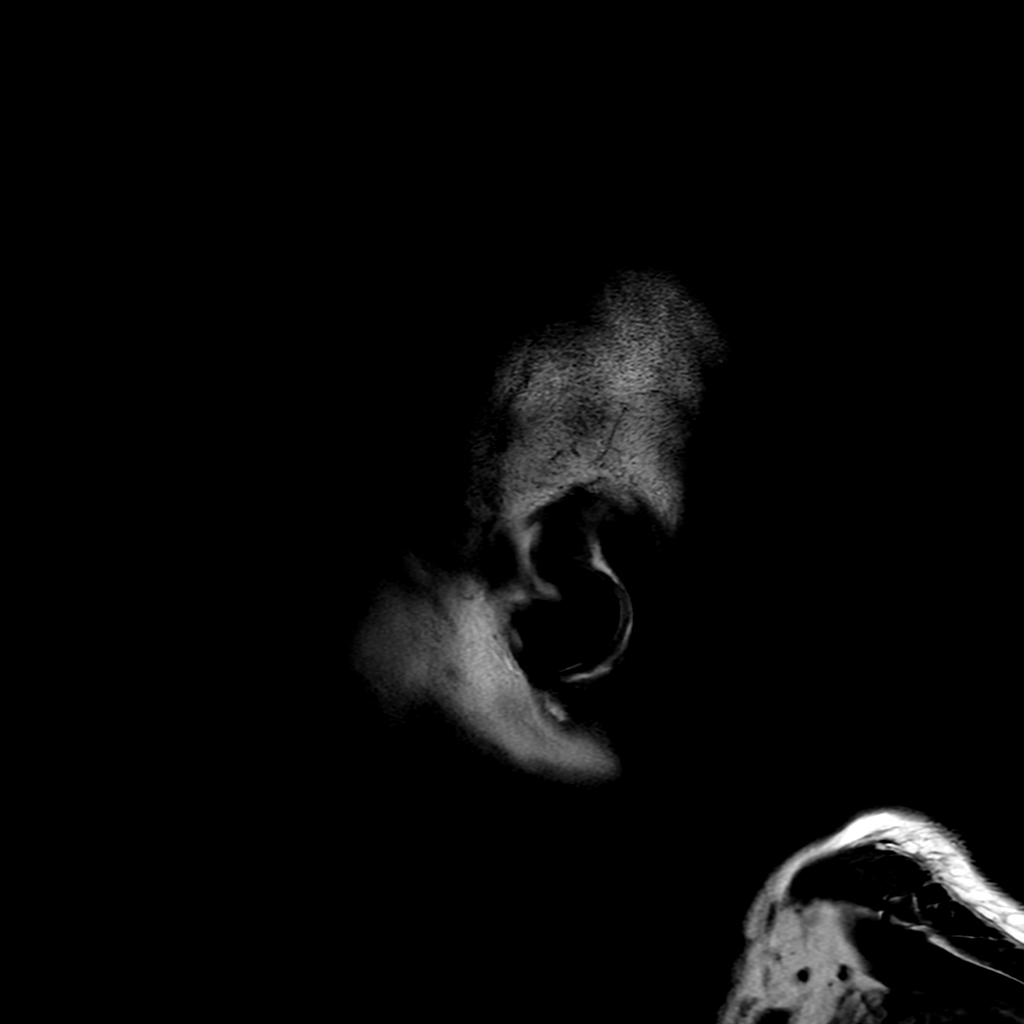
[im 23/23]
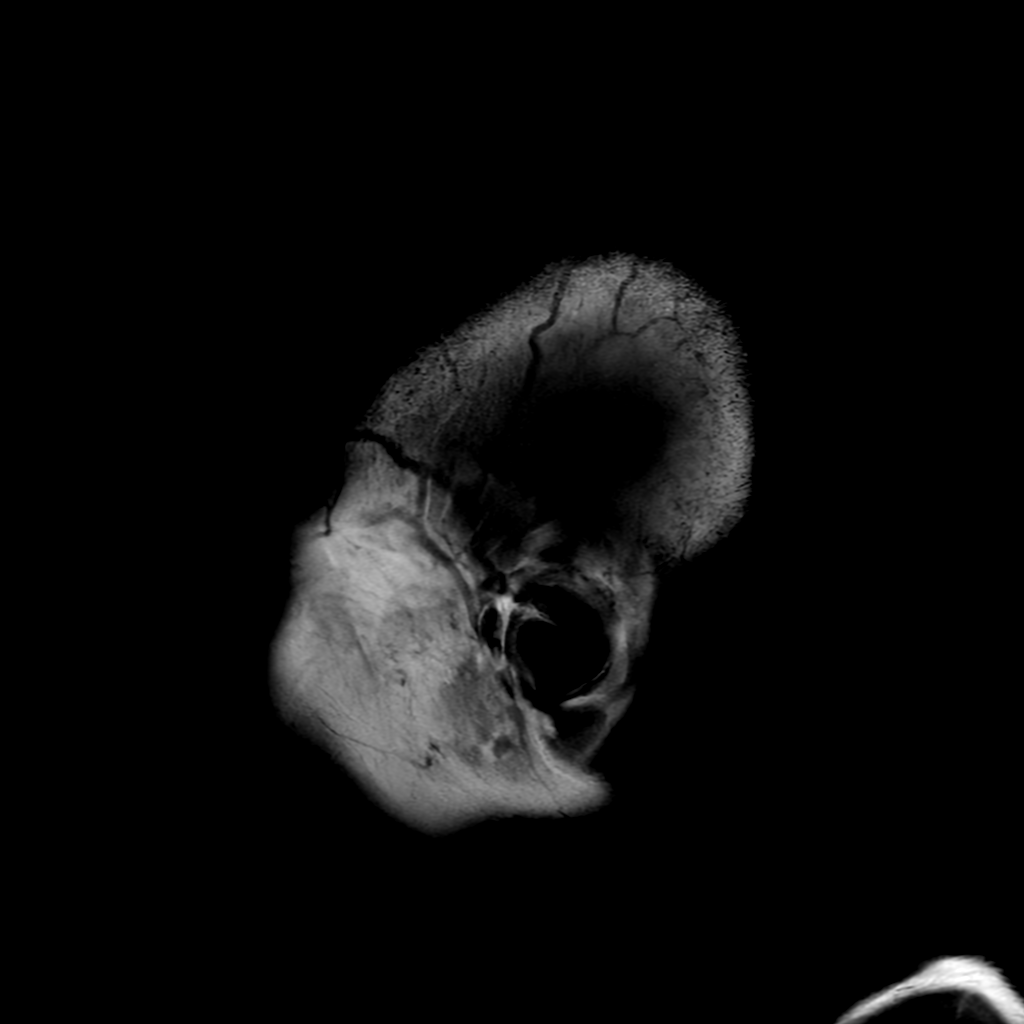

[Series 6: FLAIR · axial · 3.0mm · 0.41mm/px · z∈[-76,+54]mm · 2 of 23 slices shown (2 of 2)]
[im 1/23]
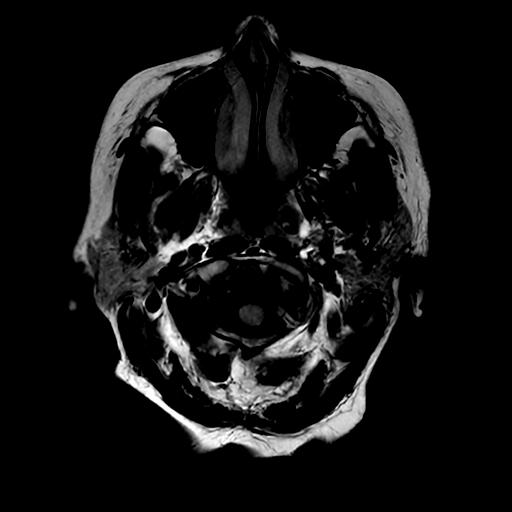
[im 23/23]
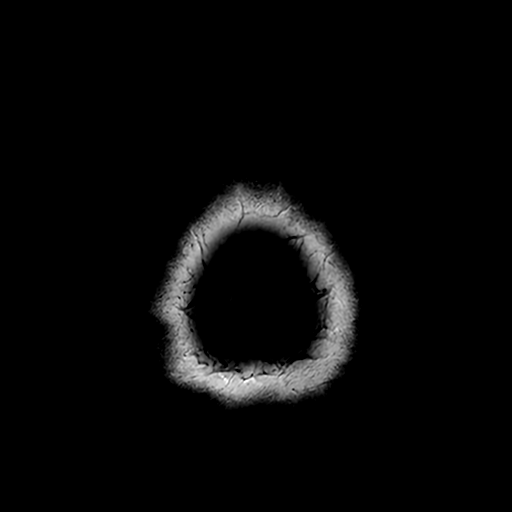

[Series 250: ADC · axial · 3.0mm · 0.94mm/px · z∈[-99,+47]mm · 5 of 50 slices shown (1 of 2)]
[im 1/50]
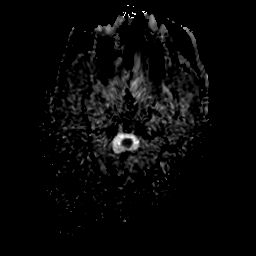
[im 13/50]
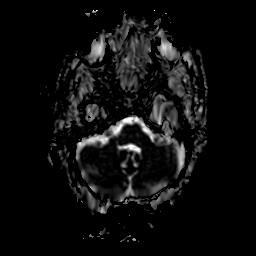
[im 25/50]
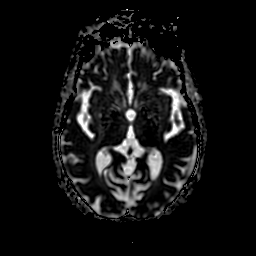
[im 37/50]
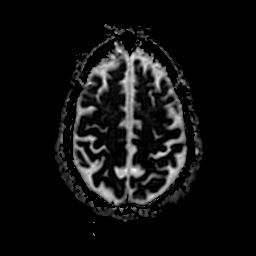
[im 50/50]
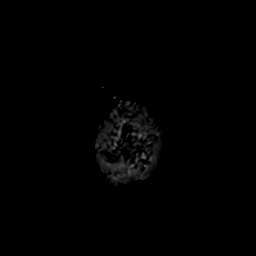

[Series 350: ADC · coronal · 4.0mm · 0.94mm/px · 3 of 37 slices shown (2 of 2)]
[im 1/37]
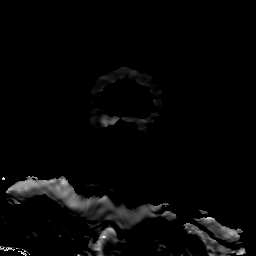
[im 19/37]
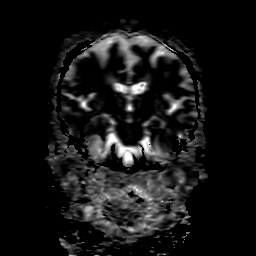
[im 37/37]
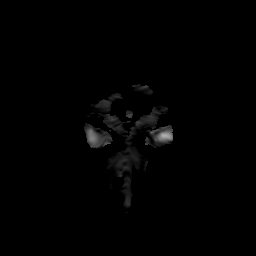

[27 of 48 positions shown; findings below may reference images not displayed]

FINDINGS: Brain: 1 month ago, the patient presented with acute infarction in
the left PCA territory. Today's study shows some residual signal
abnormality related to that acute infarction [REDACTED], but also shows
presence of some extension of infarction in the vascular territory
affecting areas of the brain not involved previously, most notably
areas in the cerebral peduncle and thalamus and the splenium of the
corpus callosum. No evidence of hemorrhage or mass effect. Chronic
small-vessel ischemic changes again seen affecting the pons and
cerebellum. Small-vessel changes throughout the thalami and
hemispheric deep white matter. No mass, hemorrhage, hydrocephalus or
extra-axial collection.

Vascular: Major vessels at the base of the brain show flow.

Skull and upper cervical spine: Negative

Sinuses/Orbits: Clear/normal

Other: None
IMPRESSION: Background pattern of extensive chronic small-vessel ischemic
changes throughout the brain.

Changes of subacute infarction in the left PCA territory that were
acute on 02/01/2020. Acute extension of infarction in the left PCA
territory, affecting areas of the brain not involved previously,
including the left cerebral peduncle, more involvement in the
lateral thalamus, and more involvement in the splenium of the corpus
callosum.
